# Patient Record
Sex: Male | Born: 1970 | Race: White | Hispanic: No | Marital: Married | Smoking: Current every day smoker
Health system: Southern US, Community
[De-identification: ages and names within clinical notes are randomized; demographics above are authoritative.]

## PROBLEM LIST (undated history)

## (undated) DIAGNOSIS — I10 Essential (primary) hypertension: Secondary | ICD-10-CM

## (undated) DIAGNOSIS — E785 Hyperlipidemia, unspecified: Secondary | ICD-10-CM

## (undated) DIAGNOSIS — Z8709 Personal history of other diseases of the respiratory system: Secondary | ICD-10-CM

## (undated) DIAGNOSIS — R160 Hepatomegaly, not elsewhere classified: Secondary | ICD-10-CM

## (undated) DIAGNOSIS — K219 Gastro-esophageal reflux disease without esophagitis: Secondary | ICD-10-CM

## (undated) DIAGNOSIS — J45909 Unspecified asthma, uncomplicated: Secondary | ICD-10-CM

## (undated) HISTORY — DX: Unspecified asthma, uncomplicated: J45.909

## (undated) HISTORY — PX: KNEE SURGERY: SHX244

## (undated) HISTORY — PX: SHOULDER SURGERY: SHX246

## (undated) HISTORY — DX: Personal history of other diseases of the respiratory system: Z87.09

## (undated) HISTORY — DX: Hyperlipidemia, unspecified: E78.5

## (undated) HISTORY — DX: Hepatomegaly, not elsewhere classified: R16.0

---

## 2005-02-22 ENCOUNTER — Ambulatory Visit: Payer: Self-pay | Admitting: Orthopaedic Surgery

## 2005-04-01 ENCOUNTER — Ambulatory Visit: Payer: Self-pay | Admitting: Orthopaedic Surgery

## 2010-11-08 ENCOUNTER — Ambulatory Visit: Payer: Self-pay | Admitting: Specialist

## 2010-11-23 ENCOUNTER — Ambulatory Visit: Payer: Self-pay | Admitting: Specialist

## 2010-11-30 ENCOUNTER — Ambulatory Visit: Payer: Self-pay | Admitting: Specialist

## 2012-08-18 ENCOUNTER — Ambulatory Visit: Payer: Self-pay | Admitting: Family Medicine

## 2012-08-21 ENCOUNTER — Encounter: Payer: Self-pay | Admitting: *Deleted

## 2012-08-24 ENCOUNTER — Ambulatory Visit: Payer: Self-pay | Admitting: Emergency Medicine

## 2012-08-24 LAB — SEDIMENTATION RATE: Erythrocyte Sed Rate: 5 mm/hr (ref 0–15)

## 2012-08-24 LAB — COMPREHENSIVE METABOLIC PANEL
Albumin: 4.3 g/dL (ref 3.4–5.0)
Alkaline Phosphatase: 88 U/L (ref 50–136)
Anion Gap: 14 (ref 7–16)
BUN: 21 mg/dL — ABNORMAL HIGH (ref 7–18)
Bilirubin,Total: 0.2 mg/dL (ref 0.2–1.0)
Calcium, Total: 10 mg/dL (ref 8.5–10.1)
Chloride: 103 mmol/L (ref 98–107)
Co2: 23 mmol/L (ref 21–32)
Creatinine: 1 mg/dL (ref 0.60–1.30)
EGFR (African American): >60
EGFR (Non-African Amer.): >60
Glucose: 101 mg/dL — ABNORMAL HIGH (ref 65–99)
Osmolality: 283 (ref 275–301)
Potassium: 3.8 mmol/L (ref 3.5–5.1)
SGOT(AST): 17 U/L (ref 15–37)
SGPT (ALT): 31 U/L (ref 12–78)
Sodium: 140 mmol/L (ref 136–145)
Total Protein: 8.4 g/dL — ABNORMAL HIGH (ref 6.4–8.2)

## 2012-08-24 LAB — CBC WITH DIFFERENTIAL/PLATELET
Basophil #: 0.1 10*3/uL (ref 0.0–0.1)
Basophil %: 1 %
Eosinophil #: 0.2 10*3/uL (ref 0.0–0.7)
Eosinophil %: 1.4 %
HCT: 44.4 % (ref 40.0–52.0)
HGB: 14.8 g/dL (ref 13.0–18.0)
Lymphocyte #: 3.1 10*3/uL (ref 1.0–3.6)
Lymphocyte %: 26.1 %
MCH: 29.6 pg (ref 26.0–34.0)
MCHC: 33.3 g/dL (ref 32.0–36.0)
MCV: 89 fL (ref 80–100)
Monocyte #: 0.8 x10 3/mm (ref 0.2–1.0)
Monocyte %: 7 %
Neutrophil #: 7.7 10*3/uL — ABNORMAL HIGH (ref 1.4–6.5)
Neutrophil %: 64.5 %
Platelet: 309 10*3/uL (ref 150–440)
RBC: 5 10*6/uL (ref 4.40–5.90)
RDW: 14.1 % (ref 11.5–14.5)
WBC: 12 10*3/uL — ABNORMAL HIGH (ref 3.8–10.6)

## 2012-08-24 LAB — TSH: Thyroid Stimulating Horm: 4.22 u[IU]/mL

## 2012-08-27 ENCOUNTER — Ambulatory Visit (INDEPENDENT_AMBULATORY_CARE_PROVIDER_SITE_OTHER): Payer: BC Managed Care – PPO | Admitting: Cardiovascular Disease

## 2012-08-27 ENCOUNTER — Encounter: Payer: Self-pay | Admitting: Cardiovascular Disease

## 2012-08-27 VITALS — BP 146/90 | HR 93 | Ht 70.0 in | Wt 269.5 lb

## 2012-08-27 DIAGNOSIS — R0602 Shortness of breath: Secondary | ICD-10-CM

## 2012-08-27 DIAGNOSIS — R000 Tachycardia, unspecified: Secondary | ICD-10-CM

## 2012-08-27 DIAGNOSIS — E785 Hyperlipidemia, unspecified: Secondary | ICD-10-CM

## 2012-08-27 DIAGNOSIS — G4730 Sleep apnea, unspecified: Secondary | ICD-10-CM

## 2012-08-27 DIAGNOSIS — R079 Chest pain, unspecified: Secondary | ICD-10-CM

## 2012-08-27 DIAGNOSIS — I10 Essential (primary) hypertension: Secondary | ICD-10-CM | POA: Insufficient documentation

## 2012-08-27 MED ORDER — METOPROLOL TARTRATE 25 MG PO TABS: 25.0000 mg | ORAL_TABLET | Freq: Two times a day (BID) | ORAL | Status: DC

## 2012-08-27 NOTE — Assessment & Plan Note (Signed)
He's had a history of hyperlipidemia in the past. We'll check his lipids at her next visit. He will be establishing with [facility] for primary care. She may want to get the labs in her office.  I've encouraged him to stay away from fried foods. He also needs to exercise on a daily basis.

## 2012-08-27 NOTE — Assessment & Plan Note (Signed)
Has symptoms consistent with sleep apnea. His wife has noticed on occasion and he seems to stop breathing. 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  We'll schedule him for sleep study for further evaluation.

## 2012-08-27 NOTE — Assessment & Plan Note (Signed)
Rodney Copeland presents today for further evaluation of his hypertension. His blood pressure was markedly elevated when he presented to urgent care several weeks ago. Since that time he has drastically improved his diet. He's eating a lot less salt. He is no longer eating any fried foods. He's lost 5 pounds and his blood pressures down significantly. He's not feeling quite as well as he would've hoped.  We will add metoprolol 25 mg twice a day to his medical regimen. My hope is that with some additional weight loss and treatment of possible sleep apnea that he may not need long-term blood pressure medicines.

## 2012-08-27 NOTE — Progress Notes (Signed)
     Rodney Copeland Date of Birth  [birth year]       [facility]    [place] [address]  [address] [place], [place]  [address]   [place], [place]  [address] [phone]     [phone]   Fax  [phone]    Fax [phone]  Problem List: 1. Hypertension 2. Possible sleep apnea  History of Present Illness:  He presents today for further evaluation of HTN.  He has been to urgent care on 2 different occasions .  BP was 170 / 110.    He is tired all of the time.  His wife thinks that he has "stopped breathing" at times when he is sleeping.  He is tired all of the time.  He can fall asleep at any time during the day.   These episodes have been going on about a month or so.  He's changed his diet for the better since seeing the doctor at urgent care. He is no longer eating a fried foods. He has cut out all of his soft drinks.  He's lost about 5 pounds since that time. He's not convinced that he is feeling better yet.    Current Outpatient Prescriptions on File Prior to Visit  Medication Sig Dispense Refill  . fluticasone (FLONASE) 50 MCG/ACT nasal spray Place 2 sprays into the nose daily.        No Known Allergies  Past Medical History  Diagnosis Date  . Hx of chronic sinusitis   . Hyperlipidemia   . Asthma   . Enlarged liver     Past Surgical History  Procedure Date  . Shoulder surgery     RIGHT  . Knee surgery     LEFT    History  Smoking status  . Current Every Day Smoker -- 1.0 packs/day for 20 years  . Types: Cigarettes  Smokeless tobacco  . Not on file    History  Alcohol Use No    Family History  Problem Relation Age of Onset  . Heart failure Mother   . Heart attack Father     Reviw of Systems:  Reviewed in the HPI.  All other systems are negative.  Physical Exam: Blood pressure 146/90, pulse 93, height 5\' 10"  (1.778 m), weight 269 lb 8 oz (122.244 kg). General: Well developed, well nourished, in  no acute distress.  Head: Normocephalic, atraumatic, sclera non-icteric, mucus membranes are moist,   Neck: Supple. Carotids are 2 + without bruits. No JVD   Lungs: Clear   Heart: RR, normal S1, S2  Abdomen: Soft, non-tender, non-distended with normal bowel sounds.  Msk:  Strength and tone are normal   Extremities: No clubbing or cyanosis. No edema.  Distal pedal pulses are 2+ and equal    Neuro: CN II - XII intact.  Alert and oriented X 3.   Psych:  Normal   ECG: Feb. 6, 2014:  NSR at 93, no ST /T abnormalities.  Assessment / Plan:

## 2012-08-27 NOTE — Patient Instructions (Addendum)
Your physician wants you to follow-up in: 2-3 months with Dr. Elease Hashimoto. You will receive a reminder letter in the mail two months in advance. If you don't receive a letter, please call our office to schedule the follow-up appointment.  Your physician has recommended that you have a sleep study. This test records several body functions during sleep, including: brain activity, eye movement, oxygen and carbon dioxide blood levels, heart rate and rhythm, breathing rate and rhythm, the flow of air through your mouth and nose, snoring, body muscle movements, and chest and belly movement.  Your physician has recommended you make the following change in your medication:  -start metoprolol 25 mg twice daily  STOP SMOKING  Labs with Dr. Dan Humphreys   DASH Diet The DASH diet stands for "Dietary Approaches to Stop Hypertension." It is a healthy eating plan that has been shown to reduce high blood pressure (hypertension) in as little as 14 days, while also possibly providing other significant health benefits. These other health benefits include reducing the risk of breast cancer after menopause and reducing the risk of type 2 diabetes, heart disease, colon cancer, and stroke. Health benefits also include weight loss and slowing kidney failure in patients with chronic kidney disease.   DIET GUIDELINES  Limit salt (sodium). Your diet should contain less than 1500 mg of sodium daily.   Limit refined or processed carbohydrates. Your diet should include mostly whole grains. Desserts and added sugars should be used sparingly.   Include small amounts of heart-healthy fats. These types of fats include nuts, oils, and tub margarine. Limit saturated and trans fats. These fats have been shown to be harmful in the body.  CHOOSING FOODS   The following food groups are based on a 2000 calorie diet. See your Registered Dietitian for individual calorie needs. Grains and Grain Products (6 to 8 servings daily)  Eat More Often:  Whole-wheat bread, brown rice, whole-grain or wheat pasta, quinoa, popcorn without added fat or salt (air popped).   Eat Less Often: White bread, white pasta, white rice, cornbread.  Vegetables (4 to 5 servings daily)  Eat More Often: Fresh, frozen, and canned vegetables. Vegetables may be raw, steamed, roasted, or grilled with a minimal amount of fat.   Eat Less Often/Avoid: Creamed or fried vegetables. Vegetables in a cheese sauce.  Fruit (4 to 5 servings daily)  Eat More Often: All fresh, canned (in natural juice), or frozen fruits. Dried fruits without added sugar. One hundred percent fruit juice ( cup [237 mL] daily).   Eat Less Often: Dried fruits with added sugar. Canned fruit in light or heavy syrup.  [employer], Fish, and Poultry (2 servings or less daily. One serving is 3 to 4 oz [85-114 g]).  Eat More Often: Ninety percent or leaner ground beef, tenderloin, sirloin. Round cuts of beef, chicken breast, [place] breast. All fish. Grill, bake, or broil your meat. Nothing should be fried.   Eat Less Often/Avoid: Fatty cuts of meat, [place], or chicken leg, thigh, or wing. Fried cuts of meat or fish.  Dairy (2 to 3 servings)  Eat More Often: Low-fat or fat-free milk, low-fat plain or light yogurt, reduced-fat or part-skim cheese.   Eat Less Often/Avoid: Milk (whole, 2%). Whole milk yogurt. Full-fat cheeses.  Nuts, Seeds, and Legumes (4 to 5 servings per week)  Eat More Often: All without added salt.   Eat Less Often/Avoid: Salted nuts and seeds, canned beans with added salt.  Fats and Sweets (limited)  Eat More  Often: Vegetable oils, tub margarines without trans fats, sugar-free gelatin. Mayonnaise and salad dressings.   Eat Less Often/Avoid: Coconut oils, palm oils, butter, stick margarine, cream, half and half, cookies, candy, pie.  FOR MORE INFORMATION The Dash Diet Eating Plan: www.dashdiet.org Document Released: 06/27/2011 Document Revised: 09/30/2011 Document Reviewed:  06/27/2011 [facility] Patient Information 2013 [place], [place].   Smoking Cessation Quitting smoking is important to your health and has many advantages. However, it is not always easy to quit since nicotine is a very addictive drug. Often times, people try 3 times or more before being able to quit. This document explains the best ways for you to prepare to quit smoking. Quitting takes hard work and a lot of effort, but you can do it. ADVANTAGES OF QUITTING SMOKING  You will live longer, feel better, and live better.   Your body will feel the impact of quitting smoking almost immediately.   Within 20 minutes, blood pressure decreases. Your pulse returns to its normal level.   After 8 hours, carbon monoxide levels in the blood return to normal. Your oxygen level increases.   After 24 hours, the chance of having a heart attack starts to decrease. Your breath, hair, and body stop smelling like smoke.   After 48 hours, damaged nerve endings begin to recover. Your sense of taste and smell improve.   After 72 hours, the body is virtually free of nicotine. Your bronchial tubes relax and breathing becomes easier.   After 2 to 12 weeks, lungs can hold more air. Exercise becomes easier and circulation improves.   The risk of having a heart attack, stroke, cancer, or lung disease is greatly reduced.   After 1 year, the risk of coronary heart disease is cut in half.   After 5 years, the risk of stroke falls to the same as a nonsmoker.   After 10 years, the risk of lung cancer is cut in half and the risk of other cancers decreases significantly.   After 15 years, the risk of coronary heart disease drops, usually to the level of a nonsmoker.   If you are pregnant, quitting smoking will improve your chances of having a healthy baby.   The people you live with, especially any children, will be healthier.   You will have extra money to spend on things other than cigarettes.  QUESTIONS TO THINK  ABOUT BEFORE ATTEMPTING TO QUIT You may want to talk about your answers with your caregiver.  Why do you want to quit?   If you tried to quit in the past, what helped and what did not?   What will be the most difficult situations for you after you quit? How will you plan to handle them?   Who can help you through the tough times? Your family? Friends? A caregiver?   What pleasures do you get from smoking? What ways can you still get pleasure if you quit?  Here are some questions to ask your caregiver:  How can you help me to be successful at quitting?   What medicine do you think would be best for me and how should I take it?   What should I do if I need more help?   What is smoking withdrawal like? How can I get information on withdrawal?  GET READY  Set a quit date.   Change your environment by getting rid of all cigarettes, ashtrays, matches, and lighters in your home, car, or work. Do not let people smoke in your  home.   Review your past attempts to quit. Think about what worked and what did not.  GET SUPPORT AND ENCOURAGEMENT You have a better chance of being successful if you have help. You can get support in many ways.  Tell your family, friends, and co-workers that you are going to quit and need their support. Ask them not to smoke around you.   Get individual, group, or telephone counseling and support. Programs are available at [employer] and health centers. Call your local health department for information about programs in your area.   Spiritual beliefs and practices may help some smokers quit.   Download a "quit meter" on your computer to keep track of quit statistics, such as how long you have gone without smoking, cigarettes not smoked, and money saved.   Get a self-help book about quitting smoking and staying off of tobacco.  LEARN NEW SKILLS AND BEHAVIORS  Distract yourself from urges to smoke. Talk to someone, go for a walk, or occupy your time with a  task.   Change your normal routine. Take a different route to work. Drink tea instead of coffee. Eat breakfast in a different place.   Reduce your stress. Take a hot bath, exercise, or read a book.   Plan something enjoyable to do every day. Reward yourself for not smoking.   Explore interactive web-based programs that specialize in helping you quit.  GET MEDICINE AND USE IT CORRECTLY Medicines can help you stop smoking and decrease the urge to smoke. Combining medicine with the above behavioral methods and support can greatly increase your chances of successfully quitting smoking.  Nicotine replacement therapy helps deliver nicotine to your body without the negative effects and risks of smoking. Nicotine replacement therapy includes nicotine gum, lozenges, inhalers, nasal sprays, and skin patches. Some may be available over-the-counter and others require a prescription.   Antidepressant medicine helps people abstain from smoking, but how this works is unknown. This medicine is available by prescription.   Nicotinic receptor partial agonist medicine simulates the effect of nicotine in your brain. This medicine is available by prescription.  Ask your caregiver for advice about which medicines to use and how to use them based on your health history. Your caregiver will tell you what side effects to look out for if you choose to be on a medicine or therapy. Carefully read the information on the package. Do not use any other product containing nicotine while using a nicotine replacement product.   RELAPSE OR DIFFICULT SITUATIONS Most relapses occur within the first 3 months after quitting. Do not be discouraged if you start smoking again. Remember, most people try several times before finally quitting. You may have symptoms of withdrawal because your body is used to nicotine. You may crave cigarettes, be irritable, feel very hungry, cough often, get headaches, or have difficulty concentrating. The  withdrawal symptoms are only temporary. They are strongest when you first quit, but they will go away within 10 14 days. To reduce the chances of relapse, try to:  Avoid drinking alcohol. Drinking lowers your chances of successfully quitting.   Reduce the amount of caffeine you consume. Once you quit smoking, the amount of caffeine in your body increases and can give you symptoms, such as a rapid heartbeat, sweating, and anxiety.   Avoid smokers because they can make you want to smoke.   Do not let weight gain distract you. Many smokers will gain weight when they quit, usually less than 10 pounds.  Eat a healthy diet and stay active. You can always lose the weight gained after you quit.   Find ways to improve your mood other than smoking.  FOR MORE INFORMATION   www.smokefree.gov   Document Released: 07/02/2001 Document Revised: 01/07/2012 Document Reviewed: 10/17/2011 [facility] Patient Information 2013 [place], [place].

## 2012-10-22 ENCOUNTER — Encounter: Payer: Self-pay | Admitting: Cardiovascular Disease

## 2012-10-22 ENCOUNTER — Ambulatory Visit (INDEPENDENT_AMBULATORY_CARE_PROVIDER_SITE_OTHER): Payer: BC Managed Care – PPO | Admitting: Cardiovascular Disease

## 2012-10-22 VITALS — BP 128/80 | HR 73 | Ht 70.0 in | Wt 263.8 lb

## 2012-10-22 DIAGNOSIS — I10 Essential (primary) hypertension: Secondary | ICD-10-CM

## 2012-10-22 DIAGNOSIS — G4730 Sleep apnea, unspecified: Secondary | ICD-10-CM

## 2012-10-22 NOTE — Progress Notes (Signed)
     Cain Sieve Date of Birth  [birth year]       [facility]    [place] [address]  [address] [place], [place]  [address]   [place], [place]  [address] [phone]     [phone]   Fax  [phone]    Fax [phone]  Problem List: 1. Hypertension 2. Possible sleep apnea  History of Present Illness:  He presents today for further evaluation of HTN.  He has been to urgent care on 2 different occasions .  BP was 170 / 110.    He is tired all of the time.  His wife thinks that he has "stopped breathing" at times when he is sleeping.  He is tired all of the time.  He can fall asleep at any time during the day.   These episodes have been going on about a month or so.  He's changed his diet for the better since seeing the doctor at urgent care. He is no longer eating a fried foods. He has cut out all of his soft drinks.  He's lost about 5 pounds since that time. He's not convinced that he is feeling better yet.  October 22, 2012:   he is feeling much better.    Current Outpatient Prescriptions on File Prior to Visit  Medication Sig Dispense Refill  . fluticasone (FLONASE) 50 MCG/ACT nasal spray Place 2 sprays into the nose daily.      . metoprolol tartrate (LOPRESSOR) 25 MG tablet Take 1 tablet (25 mg total) by mouth 2 (two) times daily.  60 tablet  12  . ASPIRIN PO Take by mouth as needed.       No current facility-administered medications on file prior to visit.    No Known Allergies  Past Medical History  Diagnosis Date  . Hx of chronic sinusitis   . Hyperlipidemia   . Asthma   . Enlarged liver     Past Surgical History  Procedure Laterality Date  . Shoulder surgery      RIGHT  . Knee surgery      LEFT    History  Smoking status  . Current Every Day Smoker -- 1.00 packs/day for 20 years  . Types: Cigarettes  Smokeless tobacco  . Not on file    History  Alcohol Use No    Family History  Problem Relation  Age of Onset  . Heart failure Mother   . Heart attack Father     Reviw of Systems:  Reviewed in the HPI.  All other systems are negative.  Physical Exam: Blood pressure 128/80, pulse 73, height 5\' 10"  (1.778 m), weight 263 lb 12 oz (119.636 kg). General: Well developed, well nourished, in no acute distress.  Head: Normocephalic, atraumatic, sclera non-icteric, mucus membranes are moist,   Neck: Supple. Carotids are 2 + without bruits. No JVD   Lungs: Clear   Heart: RR, normal S1, S2  Abdomen: Soft, non-tender, non-distended with normal bowel sounds.  Msk:  Strength and tone are normal   Extremities: No clubbing or cyanosis. No edema.  Distal pedal pulses are 2+ and equal    Neuro: CN II - XII intact.  Alert and oriented X 3.   Psych:  Normal   ECG: October 22, 2012:  NSR at 73, normal.   Assessment / Plan:

## 2012-10-22 NOTE — Assessment & Plan Note (Signed)
Rodney Copeland is feeling much better. He has changed his diet . He is eating a lot of salt. He is tolerating metoprolol without problems.  We will see him again in the office in 6 months for followup office visit. We'll check a basic profile at that time. I have encouraged him to continue the exercise.

## 2012-10-22 NOTE — Assessment & Plan Note (Signed)
He snores loudly. He was unable to get a sleep study because of insurance reasons. This does not make any sense to me. He has sleep apnea, this may be the cause of his underlying hypertension.  I have asked him to see if his medical Dr. Stann Mainland  make a referral for the sleep study. Perhaps this will make a  difference in whether or not the insurance company will  authorize the sleep study.

## 2012-10-22 NOTE — Patient Instructions (Addendum)
Your physician wants you to follow-up in: 6 months with Dr. Elease Hashimoto with labs at that time. You will receive a reminder letter in the mail two months in advance. If you don't receive a letter, please call our office to schedule the follow-up appointment.

## 2012-12-10 ENCOUNTER — Ambulatory Visit: Payer: BC Managed Care – PPO | Admitting: Internal Medicine

## 2013-04-24 ENCOUNTER — Ambulatory Visit: Payer: Self-pay | Admitting: Internal Medicine

## 2013-07-21 ENCOUNTER — Ambulatory Visit: Payer: Self-pay | Admitting: Physician Assistant

## 2014-03-18 ENCOUNTER — Ambulatory Visit: Payer: Self-pay | Admitting: Specialist

## 2015-08-04 ENCOUNTER — Encounter: Payer: Self-pay | Admitting: *Deleted

## 2015-08-04 ENCOUNTER — Ambulatory Visit
Admission: EM | Admit: 2015-08-04 | Discharge: 2015-08-04 | Disposition: A | Discharge status: Home / Self Care | Payer: 59 | Attending: Family Medicine | Admitting: Family Medicine

## 2015-08-04 DIAGNOSIS — R05 Cough: Secondary | ICD-10-CM | POA: Diagnosis not present

## 2015-08-04 DIAGNOSIS — R112 Nausea with vomiting, unspecified: Secondary | ICD-10-CM

## 2015-08-04 DIAGNOSIS — J0100 Acute maxillary sinusitis, unspecified: Secondary | ICD-10-CM

## 2015-08-04 DIAGNOSIS — R059 Cough, unspecified: Secondary | ICD-10-CM

## 2015-08-04 HISTORY — DX: Essential (primary) hypertension: I10

## 2015-08-04 MED ORDER — GUAIFENESIN-CODEINE 100-10 MG/5ML PO SOLN: ORAL | Status: DC

## 2015-08-04 MED ORDER — AMOXICILLIN 875 MG PO TABS: 875.0000 mg | ORAL_TABLET | Freq: Two times a day (BID) | ORAL | Status: DC

## 2015-08-04 MED ORDER — ONDANSETRON 8 MG PO TBDP: 8.0000 mg | ORAL_TABLET | Freq: Two times a day (BID) | ORAL | Status: DC

## 2015-08-04 NOTE — ED Notes (Signed)
Patient started having symptoms of nausea vomiting and diarrhea one week ago and symptoms resolved over the weekend. This past Tuesday symptoms of nausea, vomiting and diarrhea returned. Patient present with a sever cough that may be due to aspiration of vomit.

## 2015-08-04 NOTE — ED Provider Notes (Signed)
CSN: [id number]     Arrival date & time 08/04/15  1532 History   First MD Initiated Contact with Patient 08/04/15 1622     Chief Complaint  Patient presents with  . Cough  . Nausea   (Consider location/radiation/quality/duration/timing/severity/associated sxs/prior Treatment) Patient is a 45 y.o. male presenting with URI. The history is provided by the patient.  URI Presenting symptoms: congestion, cough, ear pain, facial pain and fever   Severity:  Moderate Onset quality:  Sudden Duration:  1 week Timing:  Constant Progression:  Worsening Chronicity:  New Relieved by:  Nothing Associated symptoms: headaches and sinus pain   Associated symptoms comment:  Vomiting x 3 today; "throwing up mucus"   Past Medical History  Diagnosis Date  . Hx of chronic sinusitis   . Hyperlipidemia   . Asthma   . Enlarged liver   . Hypertension    Past Surgical History  Procedure Laterality Date  . Shoulder surgery      RIGHT  . Knee surgery      LEFT   Family History  Problem Relation Age of Onset  . Heart failure Mother   . Heart attack Father    Social History  Substance Use Topics  . Smoking status: Current Every Day Smoker -- 1.00 packs/day for 20 years    Types: Cigarettes  . Smokeless tobacco: None  . Alcohol Use: No    Review of Systems  Constitutional: Positive for fever.  HENT: Positive for congestion and ear pain.   Respiratory: Positive for cough.   Neurological: Positive for headaches.    Allergies  Review of patient's allergies indicates no known allergies.  Home Medications   Prior to Admission medications   Medication Sig Start Date End Date Taking? Authorizing Provider  amoxicillin (AMOXIL) 875 MG tablet Take 1 tablet (875 mg total) by mouth 2 (two) times daily. 08/04/15   Payton Mccallumrlando Robinette Esters, MD  ASPIRIN PO Take by mouth as needed.    Historical Provider, MD  fluticasone (FLONASE) 50 MCG/ACT nasal spray Place 2 sprays into the nose daily.    Historical  Provider, MD  guaiFENesin-codeine 100-10 MG/5ML syrup 5-10 ml po tid prn cough 08/04/15   Payton Mccallumrlando Kennie Karapetian, MD  metoprolol tartrate (LOPRESSOR) 25 MG tablet Take 1 tablet (25 mg total) by mouth 2 (two) times daily. 08/27/12   Vesta MixerPhilip J Nahser, MD  ondansetron (ZOFRAN ODT) 8 MG disintegrating tablet Take 1 tablet (8 mg total) by mouth 2 (two) times daily. 08/04/15   Payton Mccallumrlando Asante Ritacco, MD   Meds Ordered and Administered this Visit  Medications - No data to display  BP 136/84 mmHg  Pulse 110  Temp(Src) 99.3 F (37.4 C) (Oral)  Resp 22  Ht 5\' 10"  (1.778 m)  Wt 275 lb (124.739 kg)  BMI 39.46 kg/m2  SpO2 98% No data found.   Physical Exam  Constitutional: He appears well-developed and well-nourished. No distress.  HENT:  Head: Normocephalic and atraumatic.  Right Ear: External ear and ear canal normal. A middle ear effusion is present.  Left Ear: External ear and ear canal normal. A middle ear effusion is present.  Nose: Mucosal edema and rhinorrhea present. Right sinus exhibits maxillary sinus tenderness and frontal sinus tenderness. Left sinus exhibits maxillary sinus tenderness and frontal sinus tenderness.  Mouth/Throat: Uvula is midline and mucous membranes are normal. Posterior oropharyngeal erythema present. No oropharyngeal exudate, posterior oropharyngeal edema or tonsillar abscesses.  Eyes: Conjunctivae and EOM are normal. Pupils are equal, round, and reactive to light.  Right eye exhibits no discharge. Left eye exhibits no discharge. No scleral icterus.  Neck: Normal range of motion. Neck supple. No tracheal deviation present. No thyromegaly present.  Cardiovascular: Normal rate, regular rhythm and normal heart sounds.   Pulmonary/Chest: Effort normal and breath sounds normal. No stridor. No respiratory distress. He has no wheezes. He has no rales. He exhibits no tenderness.  Lymphadenopathy:    He has no cervical adenopathy.  Neurological: He is alert.  Skin: Skin is warm and dry. No rash  noted. He is not diaphoretic.  Nursing note and vitals reviewed.   ED Course  Procedures (including critical care time)  Labs Review Labs Reviewed - No data to display  Imaging Review No results found.   Visual Acuity Review  Right Eye Distance:   Left Eye Distance:   Bilateral Distance:    Right Eye Near:   Left Eye Near:    Bilateral Near:         MDM   1. Acute maxillary sinusitis, recurrence not specified   2. Cough   3. Non-intractable vomiting with nausea, vomiting of unspecified type    New Prescriptions   AMOXICILLIN (AMOXIL) 875 MG TABLET    Take 1 tablet (875 mg total) by mouth 2 (two) times daily.   GUAIFENESIN-CODEINE 100-10 MG/5ML SYRUP    5-10 ml po tid prn cough   ONDANSETRON (ZOFRAN ODT) 8 MG DISINTEGRATING TABLET    Take 1 tablet (8 mg total) by mouth 2 (two) times daily.   1. diagnosis reviewed with patient 2. rx as per orders above; reviewed possible side effects, interactions, risks and benefits  3. Recommend supportive treatment with otc analgesics, otc flonase, clear liquids then advance slowly as tolerated 4. Follow-up prn if symptoms worsen or don't improve  Payton Mccallum, MD 08/04/15 1730

## 2018-09-03 ENCOUNTER — Other Ambulatory Visit: Payer: Self-pay | Admitting: Student

## 2018-09-03 DIAGNOSIS — M25512 Pain in left shoulder: Secondary | ICD-10-CM

## 2018-09-03 DIAGNOSIS — M75112 Incomplete rotator cuff tear or rupture of left shoulder, not specified as traumatic: Secondary | ICD-10-CM

## 2018-09-11 ENCOUNTER — Ambulatory Visit
Admission: RE | Admit: 2018-09-11 | Discharge: 2018-09-11 | Disposition: A | Discharge status: Home / Self Care | Payer: 59 | Source: Ambulatory Visit | Attending: Student | Admitting: Student

## 2018-09-11 ENCOUNTER — Encounter (INDEPENDENT_AMBULATORY_CARE_PROVIDER_SITE_OTHER): Payer: Self-pay

## 2018-09-11 DIAGNOSIS — M75112 Incomplete rotator cuff tear or rupture of left shoulder, not specified as traumatic: Secondary | ICD-10-CM | POA: Diagnosis present

## 2018-09-11 DIAGNOSIS — M25512 Pain in left shoulder: Secondary | ICD-10-CM

## 2019-01-06 ENCOUNTER — Telehealth: Payer: Self-pay | Admitting: Internal Medicine

## 2019-01-06 NOTE — Telephone Encounter (Signed)
Okay to proceed, as pt was seen by PCP on 10/14/2018 for chronic cough.

## 2019-01-06 NOTE — Telephone Encounter (Signed)
Called patient for COVID-19 pre-screening for in office visit.  Have you recently traveled any where out of the local area in the last [facility]   Have you been in close contact with a person diagnosed with COVID-19 within the last 2 weeks? No  Do you currently have any of the following symptoms? If so, when did they start? Cough (Yes- smokers cough)   Diarrhea Joint Pain Fever     Muscle Pain   Red eyes Shortness of breath  Abdominal pain  Vomiting Loss of smell   Rash               Sore Throat Headache   Weakness  Bruising or bleeding

## 2019-01-07 ENCOUNTER — Ambulatory Visit (INDEPENDENT_AMBULATORY_CARE_PROVIDER_SITE_OTHER): Payer: 59 | Admitting: Internal Medicine

## 2019-01-07 ENCOUNTER — Ambulatory Visit: Payer: 59

## 2019-01-07 ENCOUNTER — Encounter: Payer: Self-pay | Admitting: Internal Medicine

## 2019-01-07 ENCOUNTER — Ambulatory Visit
Admission: RE | Admit: 2019-01-07 | Discharge: 2019-01-07 | Disposition: A | Discharge status: Home / Self Care | Payer: 59 | Source: Ambulatory Visit | Attending: Internal Medicine | Admitting: Internal Medicine

## 2019-01-07 ENCOUNTER — Other Ambulatory Visit: Payer: Self-pay

## 2019-01-07 VITALS — BP 144/88 | HR 92 | Temp 98.1°F | Ht 69.5 in | Wt 295.4 lb

## 2019-01-07 DIAGNOSIS — J42 Unspecified chronic bronchitis: Secondary | ICD-10-CM | POA: Insufficient documentation

## 2019-01-07 DIAGNOSIS — G4719 Other hypersomnia: Secondary | ICD-10-CM

## 2019-01-07 MED ORDER — BUDESONIDE-FORMOTEROL FUMARATE 160-4.5 MCG/ACT IN AERO: 2.0000 | INHALATION_SPRAY | Freq: Two times a day (BID) | RESPIRATORY_TRACT | 12 refills | Status: AC

## 2019-01-07 NOTE — Progress Notes (Addendum)
Rodney [place]      Assessment and Plan:  Dyspnea. - Progressive dyspnea for the past year, suspect this is multifactorial from possible asthma/chronic bronchitis, nicotine abuse, 40 pound weight gain over the past year. - We will start empiric Symbicort inhaler 2 puffs twice daily, asked to rinse mouth after use. - I have also asked him to increase his physical activity and attempt weight loss which may help with his breathing. - We will check lung function test and baseline chest x-ray.  Nicotine abuse. - Discussed importance of cessation, spent 3 minutes in discussion.  Obesity. - Recent weight gain of about 40 pounds which may be contributing and coinciding with recent increase in dyspnea. - He has a shoulder injury which is decreased his physical activity level, discussed importance of increasing his physical activity and limiting calories.  Excessive daytime sleepiness -Symptoms and signs of obstructive sleep apnea. - We will send for sleep study, start on CPAP as indicated.  Orders Placed This Encounter  Procedures  . DG Chest 2 View  . Pulmonary Function Test [facility] Only  . Home sleep test   Meds ordered this encounter  Medications  . budesonide-formoterol (SYMBICORT) 160-4.5 MCG/ACT inhaler    Sig: Inhale 2 puffs into the lungs 2 (two) times daily. Rinse mouth after use    Dispense:  1 Inhaler    Refill:  12   Return in about 3 months (around 04/09/2019).   Date: 01/07/2019  MRN# [id number] Rodney [facility] 10/19/1970    Rodney [facility] is a 48 y.o. old male seen in [place] for chief complaint of:    Chief Complaint  Patient presents with  . Consult    Per Janyce Llanos for asthma and possible COPD, get short of breath during physical activity, several cases of bronchitits in the last 10 years, some smokers cough, occasionally productive in the mornings, chest tightness with shortness of breath     HPI:   He has been  having trouble breathing for about 2 years and progressive, now worse over the past year. Made worse by walking especially if he is carrying something heavy. Relieved by rest, he has been using albuterol which helps sometimes.  Breathing is worse by hot, humid days.  He has 3 dogs, they are occasionally in bedroom.  He has had reflux now controlled with diet.  He has stuffy nose, takes flonase occasionally.   He has never been tested for allergies He is smoking about a ppd, he has tried chantix but made him very nauseated. He has quit for 1.5 yrs 10 years ago, has quit several other times since then for a few months at a time.  His weight has gone up about 40 pounds in past year, he has had a left shoulder injury and has not been doing as much physical activity since then.   He is sleepy during the day, he has been told that he needs to get a sleep study but has put it off due to time constraints, epworth is 12 today. He snores loudly and has been told that he stops breathing in his sleep.      PMHX:   Past Medical History:  Diagnosis Date  . Asthma   . Enlarged liver   . Hx of chronic sinusitis   . Hyperlipidemia   . Hypertension    Surgical Hx:  Past Surgical History:  Procedure Laterality Date  . KNEE SURGERY     LEFT  . SHOULDER  SURGERY     RIGHT   Family Hx:  Family History  Problem Relation Age of Onset  . Heart failure Mother   . Heart attack Father    Social Hx:   Social History   Tobacco Use  . Smoking status: Current Every Day Smoker    Packs/day: 1.00    Years: 20.00    Pack years: 20.00    Types: Cigarettes  . Smokeless tobacco: Former NeurosurgeonUser  . Tobacco comment: vaped for about 9 months, two years ago  Substance Use Topics  . Alcohol use: No  . Drug use: No   Medication:    Current Outpatient Medications:  .  ASPIRIN PO, Take 81 mg by mouth daily. , Disp: , Rfl:  .  fluticasone (FLONASE) 50 MCG/ACT nasal spray, Place 2 sprays into the nose daily.,  Disp: , Rfl:    Allergies:  Patient has no known allergies.  Review of Systems: Gen:  Denies  fever, sweats, chills HEENT: Denies blurred vision, double vision. bleeds, sore throat Cvc:  No dizziness, chest pain. Resp:   Denies cough or sputum production, shortness of breath Gi: Denies swallowing difficulty, stomach pain. Gu:  Denies bladder incontinence, burning urine Ext:   No Joint pain, stiffness. Skin: No skin rash,  hives  Endoc:  No polyuria, polydipsia. Psych: No depression, insomnia. Other:  All other systems were reviewed with the patient and were negative other that what is mentioned in the HPI.   Physical Examination:   VS: BP (!) 144/88 (BP Location: Left Arm, Cuff Size: Normal)   Pulse 92   Temp 98.1 F (36.7 C) (Temporal)   Ht 5' 9.5" (1.765 m)   Wt 295 lb 6.4 oz (134 kg)   SpO2 98%   BMI 43.00 kg/m   General Appearance: No distress  Neuro:without focal findings,  speech normal, Mallampati 3. HEENT: PERRLA, EOM intact.   Pulmonary: normal breath sounds, No wheezing.  CardiovascularNormal S1,S2.  No m/r/g.   Abdomen: Benign, Soft, non-tender. Renal:  No costovertebral tenderness  GU:  No performed at this time. Endoc: No evident thyromegaly, no signs of acromegaly. Skin:   warm, no rashes, no ecchymosis  Extremities: normal, no cyanosis, clubbing.  Other findings:    LABORATORY PANEL:   CBC No results for input(s): WBC, HGB, HCT, PLT in the last 168 hours. ------------------------------------------------------------------------------------------------------------------  Chemistries  No results for input(s): NA, K, CL, CO2, GLUCOSE, BUN, CREATININE, CALCIUM, MG, AST, ALT, ALKPHOS, BILITOT in the last 168 hours.  Invalid input(s): GFRCGP ------------------------------------------------------------------------------------------------------------------  Cardiac Enzymes No results for input(s): TROPONINI in the last 168 hours.  ------------------------------------------------------------  RADIOLOGY:  No results found.     Thank  you for the [facility] and for allowing [facility] to assist in the care of your patient. Our recommendations are noted above.  Please contact us if we can be of further service.   Wells Guileseep Aram Domzalski, M.D., F.C.C.P.  Board Certified in Internal Medicine, Pulmonary Medicine, Critical Care Medicine, and Sleep Medicine.  [facility] Office Number: [phone]   01/07/2019

## 2019-01-07 NOTE — Patient Instructions (Addendum)
Will send for sleep study.  Start symbicort 2 puffs twice daily, rinse mouth each use.  Will check chest xray and lung function test.  Weight loss will be helpful for your breathing.    Sleep Apnea    Sleep apnea is disorder that affects a person's sleep. A person with sleep apnea has abnormal pauses in their breathing when they sleep. It is hard for them to get a good sleep. This makes a person tired during the day. It also can lead to other physical problems. There are three types of sleep apnea. One type is when breathing stops for a short time because your airway is blocked (obstructive sleep apnea). Another type is when the brain sometimes fails to give the normal signal to breathe to the muscles that control your breathing (central sleep apnea). The third type is a combination of the other two types.  HOME CARE   Take all medicine as told by your doctor.  Avoid alcohol, calming medicines (sedatives), and depressant drugs.  Try to lose weight if you are overweight. Talk to your doctor about a healthy weight goal.  Your doctor may have you use a device that helps to open your airway. It can help you get the air that you need. It is called a positive airway pressure (PAP) device.   MAKE SURE YOU:   Understand these instructions.  Will watch your condition.  Will get help right away if you are not doing well or get worse.  It may take approximately 1 month for you to get used to wearing her CPAP every night.  Be sure to work with your machine to get used to it, be patient, it may take time!  If you have trouble tolerating CPAP DO NOT RETURN YOUR MACHINE; Contact our office to see if we can help you tolerate the CPAP better first!

## 2019-01-14 ENCOUNTER — Other Ambulatory Visit: Payer: Self-pay

## 2019-01-14 ENCOUNTER — Ambulatory Visit: Payer: 59

## 2019-01-14 ENCOUNTER — Encounter
Admission: RE | Admit: 2019-01-14 | Discharge: 2019-01-14 | Disposition: A | Discharge status: Home / Self Care | Payer: 59 | Source: Ambulatory Visit | Attending: Surgery | Admitting: Surgery

## 2019-01-14 DIAGNOSIS — G4733 Obstructive sleep apnea (adult) (pediatric): Secondary | ICD-10-CM | POA: Diagnosis not present

## 2019-01-14 DIAGNOSIS — G4719 Other hypersomnia: Secondary | ICD-10-CM

## 2019-01-14 DIAGNOSIS — Z01810 Encounter for preprocedural cardiovascular examination: Secondary | ICD-10-CM | POA: Insufficient documentation

## 2019-01-14 DIAGNOSIS — Z1159 Encounter for screening for other viral diseases: Secondary | ICD-10-CM | POA: Insufficient documentation

## 2019-01-14 HISTORY — DX: Gastro-esophageal reflux disease without esophagitis: K21.9

## 2019-01-14 NOTE — Patient Instructions (Addendum)
Your procedure is scheduled on: 01-19-19 TUESDAY Report to Same Day Surgery 2nd floor medical mall [facility](Medical Mall Entrance-take elevator on left to 2nd floor.  Check in with surgery information desk.) To find out your arrival time please call [phone] between 1PM - 3PM on 01-18-19 MONDAY  Remember: Instructions that are not followed completely may result in serious medical risk, up to and including death, or upon the discretion of your surgeon and anesthesiologist your surgery may need to be rescheduled.    _x___ 1. Do not eat food after midnight the night before your procedure. NO GUM OR CANDY AFTER MIDNIGHT. You may drink clear liquids up to 2 hours before you are scheduled to arrive at the hospital for your procedure.  Do not drink clear liquids within 2 hours of your scheduled arrival to the hospital.  Clear liquids include  --Water or Apple juice without pulp  --Clear carbohydrate beverage such as ClearFast or Gatorade  --Black Coffee or Clear Tea (No milk, no creamers, do not add anything to the coffee or Tea   ____Ensure clear carbohydrate drink on the way to the hospital for bariatric patients  ____Ensure clear carbohydrate drink 3 hours before surgery for Dr Rutherford NailByrnett's patients if physician instructed.    __x__ 2. No Alcohol for 24 hours before or after surgery.   __x__3. No Smoking or e-cigarettes for 24 prior to surgery.  Do not use any chewable tobacco products for at least 6 hour prior to surgery   ____  4. Bring all medications with you on the day of surgery if instructed.    __x__ 5. Notify your doctor if there is any change in your medical condition     (cold, fever, infections).    x___6. On the morning of surgery brush your teeth with toothpaste and water.  You may rinse your mouth with mouth wash if you wish.  Do not swallow any toothpaste or mouthwash.   Do not wear jewelry, make-up, hairpins, clips or nail polish.  Do not wear lotions, powders, or perfumes. You  may wear deodorant.  Do not shave 48 hours prior to surgery. Men may shave face and neck.  Do not bring valuables to the hospital.    [facility] is not responsible for any belongings or valuables.               Contacts, dentures or bridgework may not be worn into surgery.  Leave your suitcase in the car. After surgery it may be brought to your room.  For patients admitted to the hospital, discharge time is determined by your treatment team.  _  Patients discharged the day of surgery will not be allowed to drive home.  You will need someone to drive you home and stay with you the night of your procedure.    Please read over the following fact sheets that you were given:   [facility] Preparing for [facility]   _x___ TAKE THE FOLLOWING MEDICATION THE MORNING OF SURGERY WITH A SMALL SIP OF WATER. These include:  1. CRESTOR  2. NEXIUM   3. TAKE A NEXIUM THE NIGHT BEFORE YOUR SURGERY  4.  5.  6.  ____Fleets enema or Magnesium Citrate as directed.   _x___ Use CHG Soap or sage wipes as directed on instruction sheet   _X__ Use inhalers on the day of surgery and bring to hospital day of surgery-USE YOUR SYMBICORT AND VENTOLIN INHALER AT HOME DAY OF SURGERY AND BRING VENTOLIN INHALER TO HOSPITAL  ____ Stop Metformin and Janumet 2 days prior to surgery.    ____ Take 1/2 of usual insulin dose the night before surgery and none on the morning surgery.   _x___ Follow recommendations from Cardiologist, Pulmonologist or PCP regarding stopping Aspirin, Coumadin, Plavix ,Eliquis, Effient, or Pradaxa, and Pletal-STOP YOUR ASPIRIN NOW  X____Stop Anti-inflammatories such as Advil, Aleve, Ibuprofen, Motrin, Naproxen, Naprosyn, Goodies powders or aspirin products NOW-OK to take Tylenol    ____ Stop supplements until after surgery.     ____ Bring C-Pap to the hospital.

## 2019-01-15 ENCOUNTER — Other Ambulatory Visit
Admission: RE | Admit: 2019-01-15 | Discharge: 2019-01-15 | Disposition: A | Discharge status: Home / Self Care | Payer: 59 | Source: Ambulatory Visit | Attending: Surgery | Admitting: Surgery

## 2019-01-15 DIAGNOSIS — Z01810 Encounter for preprocedural cardiovascular examination: Secondary | ICD-10-CM | POA: Diagnosis not present

## 2019-01-16 LAB — NOVEL CORONAVIRUS, NAA (HOSP ORDER, SEND-OUT TO REF LAB; TAT 18-24 HRS): SARS-CoV-2, NAA: NOT DETECTED

## 2019-01-18 MED ORDER — DEXTROSE 5 % IV SOLN
3.0000 g | Freq: Once | INTRAVENOUS | Status: AC
  Administered 2019-01-19: 3 g via INTRAVENOUS
  Filled 2019-01-18: qty 3

## 2019-01-19 ENCOUNTER — Ambulatory Visit: Payer: 59 | Admitting: Anesthesiology

## 2019-01-19 ENCOUNTER — Encounter: Payer: Self-pay | Admitting: *Deleted

## 2019-01-19 ENCOUNTER — Ambulatory Visit
Admission: RE | Admit: 2019-01-19 | Discharge: 2019-01-19 | Disposition: A | Discharge status: Home / Self Care | Payer: 59 | Attending: Surgery | Admitting: Surgery

## 2019-01-19 ENCOUNTER — Other Ambulatory Visit: Payer: Self-pay

## 2019-01-19 ENCOUNTER — Encounter
Admission: RE | Disposition: A | Discharge status: Home / Self Care | Payer: Self-pay | Source: Home / Self Care | Attending: Surgery

## 2019-01-19 DIAGNOSIS — K219 Gastro-esophageal reflux disease without esophagitis: Secondary | ICD-10-CM | POA: Diagnosis not present

## 2019-01-19 DIAGNOSIS — G4730 Sleep apnea, unspecified: Secondary | ICD-10-CM | POA: Insufficient documentation

## 2019-01-19 DIAGNOSIS — J329 Chronic sinusitis, unspecified: Secondary | ICD-10-CM | POA: Insufficient documentation

## 2019-01-19 DIAGNOSIS — M7582 Other shoulder lesions, left shoulder: Secondary | ICD-10-CM | POA: Diagnosis not present

## 2019-01-19 DIAGNOSIS — S43432A Superior glenoid labrum lesion of left shoulder, initial encounter: Secondary | ICD-10-CM | POA: Insufficient documentation

## 2019-01-19 DIAGNOSIS — J45909 Unspecified asthma, uncomplicated: Secondary | ICD-10-CM | POA: Diagnosis not present

## 2019-01-19 DIAGNOSIS — M7542 Impingement syndrome of left shoulder: Secondary | ICD-10-CM | POA: Insufficient documentation

## 2019-01-19 DIAGNOSIS — E669 Obesity, unspecified: Secondary | ICD-10-CM | POA: Insufficient documentation

## 2019-01-19 DIAGNOSIS — Z79899 Other long term (current) drug therapy: Secondary | ICD-10-CM | POA: Diagnosis not present

## 2019-01-19 DIAGNOSIS — Z808 Family history of malignant neoplasm of other organs or systems: Secondary | ICD-10-CM | POA: Diagnosis not present

## 2019-01-19 DIAGNOSIS — I10 Essential (primary) hypertension: Secondary | ICD-10-CM | POA: Insufficient documentation

## 2019-01-19 DIAGNOSIS — Z820 Family history of epilepsy and other diseases of the nervous system: Secondary | ICD-10-CM | POA: Insufficient documentation

## 2019-01-19 DIAGNOSIS — Z818 Family history of other mental and behavioral disorders: Secondary | ICD-10-CM | POA: Diagnosis not present

## 2019-01-19 DIAGNOSIS — E785 Hyperlipidemia, unspecified: Secondary | ICD-10-CM | POA: Diagnosis not present

## 2019-01-19 DIAGNOSIS — Z7951 Long term (current) use of inhaled steroids: Secondary | ICD-10-CM | POA: Diagnosis not present

## 2019-01-19 DIAGNOSIS — Z833 Family history of diabetes mellitus: Secondary | ICD-10-CM | POA: Diagnosis not present

## 2019-01-19 DIAGNOSIS — W19XXXA Unspecified fall, initial encounter: Secondary | ICD-10-CM | POA: Diagnosis not present

## 2019-01-19 DIAGNOSIS — F17200 Nicotine dependence, unspecified, uncomplicated: Secondary | ICD-10-CM | POA: Diagnosis not present

## 2019-01-19 DIAGNOSIS — Z8249 Family history of ischemic heart disease and other diseases of the circulatory system: Secondary | ICD-10-CM | POA: Diagnosis not present

## 2019-01-19 DIAGNOSIS — Z6841 Body Mass Index (BMI) 40.0 and over, adult: Secondary | ICD-10-CM | POA: Diagnosis not present

## 2019-01-19 DIAGNOSIS — Z7982 Long term (current) use of aspirin: Secondary | ICD-10-CM | POA: Insufficient documentation

## 2019-01-19 HISTORY — PX: SHOULDER ARTHROSCOPY WITH OPEN ROTATOR CUFF REPAIR: SHX6092

## 2019-01-19 SURGERY — ARTHROSCOPY, SHOULDER WITH REPAIR, ROTATOR CUFF, OPEN
Anesthesia: General | Site: Shoulder | Laterality: Left

## 2019-01-19 MED ORDER — LACTATED RINGERS IV SOLN: INTRAVENOUS | Status: DC

## 2019-01-19 MED ORDER — MIDAZOLAM HCL 2 MG/2ML IJ SOLN
INTRAMUSCULAR | Status: AC
  Filled 2019-01-19: qty 2

## 2019-01-19 MED ORDER — ACETAMINOPHEN 10 MG/ML IV SOLN
INTRAVENOUS | Status: AC
  Filled 2019-01-19: qty 100

## 2019-01-19 MED ORDER — BUPIVACAINE LIPOSOME 1.3 % IJ SUSP
INTRAMUSCULAR | Status: DC | PRN
  Administered 2019-01-19: 20 mL via PERINEURAL

## 2019-01-19 MED ORDER — LACTATED RINGERS IV SOLN
INTRAVENOUS | Status: DC | PRN
  Administered 2019-01-19: 10:00:00 3000 mL

## 2019-01-19 MED ORDER — PROMETHAZINE HCL 25 MG/ML IJ SOLN: 6.2500 mg | INTRAMUSCULAR | Status: DC | PRN

## 2019-01-19 MED ORDER — POTASSIUM CHLORIDE IN NACL 20-0.9 MEQ/L-% IV SOLN: INTRAVENOUS | Status: DC

## 2019-01-19 MED ORDER — SUGAMMADEX SODIUM 200 MG/2ML IV SOLN
INTRAVENOUS | Status: DC | PRN
  Administered 2019-01-19: 200 mg via INTRAVENOUS

## 2019-01-19 MED ORDER — MEPERIDINE HCL 50 MG/ML IJ SOLN: 6.2500 mg | INTRAMUSCULAR | Status: DC | PRN

## 2019-01-19 MED ORDER — OXYCODONE HCL 5 MG/5ML PO SOLN: 5.0000 mg | Freq: Once | ORAL | Status: DC | PRN

## 2019-01-19 MED ORDER — LIDOCAINE HCL (PF) 1 % IJ SOLN
INTRAMUSCULAR | Status: AC
  Filled 2019-01-19: qty 5

## 2019-01-19 MED ORDER — OXYCODONE HCL 5 MG PO TABS: 5.0000 mg | ORAL_TABLET | ORAL | Status: DC | PRN

## 2019-01-19 MED ORDER — DEXMEDETOMIDINE HCL 200 MCG/2ML IV SOLN
INTRAVENOUS | Status: DC | PRN
  Administered 2019-01-19: 16 ug via INTRAVENOUS

## 2019-01-19 MED ORDER — LIDOCAINE HCL (CARDIAC) PF 100 MG/5ML IV SOSY
PREFILLED_SYRINGE | INTRAVENOUS | Status: DC | PRN
  Administered 2019-01-19: 100 mg via INTRAVENOUS

## 2019-01-19 MED ORDER — BUPIVACAINE-EPINEPHRINE (PF) 0.5% -1:200000 IJ SOLN
INTRAMUSCULAR | Status: AC
  Filled 2019-01-19: qty 30

## 2019-01-19 MED ORDER — LIDOCAINE HCL (PF) 1 % IJ SOLN
INTRAMUSCULAR | Status: DC | PRN
  Administered 2019-01-19: 3 mL

## 2019-01-19 MED ORDER — PHENYLEPHRINE HCL (PRESSORS) 10 MG/ML IV SOLN
INTRAVENOUS | Status: DC | PRN
  Administered 2019-01-19: 200 ug via INTRAVENOUS
  Administered 2019-01-19: 100 ug via INTRAVENOUS
  Administered 2019-01-19: 200 ug via INTRAVENOUS

## 2019-01-19 MED ORDER — FAMOTIDINE 20 MG PO TABS
20.0000 mg | ORAL_TABLET | Freq: Once | ORAL | Status: AC
  Administered 2019-01-19: 20 mg via ORAL

## 2019-01-19 MED ORDER — METOCLOPRAMIDE HCL 5 MG/ML IJ SOLN: 5.0000 mg | Freq: Three times a day (TID) | INTRAMUSCULAR | Status: DC | PRN

## 2019-01-19 MED ORDER — METOCLOPRAMIDE HCL 10 MG PO TABS: 5.0000 mg | ORAL_TABLET | Freq: Three times a day (TID) | ORAL | Status: DC | PRN

## 2019-01-19 MED ORDER — EPINEPHRINE PF 1 MG/ML IJ SOLN
INTRAMUSCULAR | Status: AC
  Filled 2019-01-19: qty 2

## 2019-01-19 MED ORDER — SEVOFLURANE IN SOLN
RESPIRATORY_TRACT | Status: AC
  Filled 2019-01-19: qty 250

## 2019-01-19 MED ORDER — PROPOFOL 10 MG/ML IV BOLUS
INTRAVENOUS | Status: DC | PRN
  Administered 2019-01-19: 200 mg via INTRAVENOUS

## 2019-01-19 MED ORDER — DEXAMETHASONE SODIUM PHOSPHATE 10 MG/ML IJ SOLN
INTRAMUSCULAR | Status: DC | PRN
  Administered 2019-01-19: 10 mg via INTRAVENOUS

## 2019-01-19 MED ORDER — FENTANYL CITRATE (PF) 100 MCG/2ML IJ SOLN: 25.0000 ug | INTRAMUSCULAR | Status: DC | PRN

## 2019-01-19 MED ORDER — BUPIVACAINE-EPINEPHRINE 0.5% -1:200000 IJ SOLN
INTRAMUSCULAR | Status: DC | PRN
  Administered 2019-01-19: 30 mL

## 2019-01-19 MED ORDER — MIDAZOLAM HCL 2 MG/2ML IJ SOLN
INTRAMUSCULAR | Status: DC | PRN
  Administered 2019-01-19: 2 mg via INTRAVENOUS

## 2019-01-19 MED ORDER — ACETAMINOPHEN 10 MG/ML IV SOLN
INTRAVENOUS | Status: DC | PRN
  Administered 2019-01-19: 1000 mg via INTRAVENOUS

## 2019-01-19 MED ORDER — OXYCODONE HCL 5 MG PO TABS: 5.0000 mg | ORAL_TABLET | Freq: Once | ORAL | Status: DC | PRN

## 2019-01-19 MED ORDER — ONDANSETRON HCL 4 MG/2ML IJ SOLN
INTRAMUSCULAR | Status: DC | PRN
  Administered 2019-01-19: 4 mg via INTRAVENOUS

## 2019-01-19 MED ORDER — FENTANYL CITRATE (PF) 100 MCG/2ML IJ SOLN
INTRAMUSCULAR | Status: AC
  Filled 2019-01-19: qty 2

## 2019-01-19 MED ORDER — FAMOTIDINE 20 MG PO TABS
ORAL_TABLET | ORAL | Status: AC
  Administered 2019-01-19: 20 mg via ORAL
  Filled 2019-01-19: qty 1

## 2019-01-19 MED ORDER — FENTANYL CITRATE (PF) 100 MCG/2ML IJ SOLN
INTRAMUSCULAR | Status: DC | PRN
  Administered 2019-01-19 (×2): 50 ug via INTRAVENOUS

## 2019-01-19 MED ORDER — OXYCODONE HCL 5 MG PO TABS: 5.0000 mg | ORAL_TABLET | ORAL | 0 refills | Status: AC | PRN

## 2019-01-19 MED ORDER — ONDANSETRON HCL 4 MG/2ML IJ SOLN: 4.0000 mg | Freq: Four times a day (QID) | INTRAMUSCULAR | Status: DC | PRN

## 2019-01-19 MED ORDER — DEXAMETHASONE SODIUM PHOSPHATE 10 MG/ML IJ SOLN
INTRAMUSCULAR | Status: AC
  Filled 2019-01-19: qty 1

## 2019-01-19 MED ORDER — LACTATED RINGERS IV SOLN
INTRAVENOUS | Status: DC | PRN
  Administered 2019-01-19: 09:00:00 via INTRAVENOUS

## 2019-01-19 MED ORDER — BUPIVACAINE HCL (PF) 0.5 % IJ SOLN
INTRAMUSCULAR | Status: AC
  Filled 2019-01-19: qty 10

## 2019-01-19 MED ORDER — ROCURONIUM BROMIDE 100 MG/10ML IV SOLN
INTRAVENOUS | Status: DC | PRN
  Administered 2019-01-19: 10 mg via INTRAVENOUS
  Administered 2019-01-19: 50 mg via INTRAVENOUS
  Administered 2019-01-19: 20 mg via INTRAVENOUS

## 2019-01-19 MED ORDER — ONDANSETRON HCL 4 MG PO TABS: 4.0000 mg | ORAL_TABLET | Freq: Four times a day (QID) | ORAL | Status: DC | PRN

## 2019-01-19 MED ORDER — BUPIVACAINE LIPOSOME 1.3 % IJ SUSP
INTRAMUSCULAR | Status: AC
  Filled 2019-01-19: qty 20

## 2019-01-19 MED ORDER — BUPIVACAINE HCL (PF) 0.5 % IJ SOLN
INTRAMUSCULAR | Status: DC | PRN
  Administered 2019-01-19: 10 mL via PERINEURAL

## 2019-01-19 SURGICAL SUPPLY — 47 items
ANCHOR JUGGERKNOT WTAP NDL 2.9 (Anchor) ×2 IMPLANT
BIT DRILL JUGRKNT W/NDL BIT2.9 (DRILL) IMPLANT
BLADE FULL RADIUS 3.5 (BLADE) ×3 IMPLANT
BUR ACROMIONIZER 4.0 (BURR) ×1 IMPLANT
CANNULA SHAVER 8MMX76MM (CANNULA) ×3 IMPLANT
CHLORAPREP W/TINT 26 (MISCELLANEOUS) ×3 IMPLANT
COVER MAYO STAND STRL (DRAPES) ×3 IMPLANT
COVER WAND RF STERILE (DRAPES) ×3 IMPLANT
DRAPE IMP U-DRAPE 54X76 (DRAPES) ×6 IMPLANT
DRILL JUGGERKNOT W/NDL BIT 2.9 (DRILL) ×3
ELECT REM PT RETURN 9FT ADLT (ELECTROSURGICAL) ×3
ELECTRODE REM PT RTRN 9FT ADLT (ELECTROSURGICAL) ×1 IMPLANT
GAUZE SPONGE 4X4 12PLY STRL (GAUZE/BANDAGES/DRESSINGS) ×3 IMPLANT
GAUZE XEROFORM 1X8 LF (GAUZE/BANDAGES/DRESSINGS) ×3 IMPLANT
GLOVE BIO SURGEON STRL SZ7.5 (GLOVE) ×6 IMPLANT
GLOVE BIO SURGEON STRL SZ8 (GLOVE) ×10 IMPLANT
GLOVE BIOGEL PI IND STRL 8 (GLOVE) ×1 IMPLANT
GLOVE BIOGEL PI INDICATOR 8 (GLOVE) ×2
GLOVE INDICATOR 8.0 STRL GRN (GLOVE) ×7 IMPLANT
GOWN STRL REUS W/ TWL LRG LVL3 (GOWN DISPOSABLE) ×1 IMPLANT
GOWN STRL REUS W/ TWL XL LVL3 (GOWN DISPOSABLE) ×1 IMPLANT
GOWN STRL REUS W/TWL LRG LVL3 (GOWN DISPOSABLE) ×4
GOWN STRL REUS W/TWL XL LVL3 (GOWN DISPOSABLE) ×4
GRASPER SUT 15 45D LOW PRO (SUTURE) IMPLANT
IV LACTATED RINGER IRRG 3000ML (IV SOLUTION) ×4
IV LR IRRIG 3000ML ARTHROMATIC (IV SOLUTION) ×2 IMPLANT
MANIFOLD NEPTUNE II (INSTRUMENTS) ×3 IMPLANT
MASK FACE SPIDER DISP (MASK) ×1 IMPLANT
MAT ABSORB  FLUID 56X50 GRAY (MISCELLANEOUS) ×2
MAT ABSORB FLUID 56X50 GRAY (MISCELLANEOUS) ×1 IMPLANT
NDL FILTER BLUNT 18X1 1/2 (NEEDLE) IMPLANT
NEEDLE FILTER BLUNT 18X 1/2SAF (NEEDLE) ×2
NEEDLE FILTER BLUNT 18X1 1/2 (NEEDLE) ×1 IMPLANT
PACK ARTHROSCOPY SHOULDER (MISCELLANEOUS) ×3 IMPLANT
SLING ARM LRG DEEP (SOFTGOODS) ×1 IMPLANT
SLING ULTRA II LG (MISCELLANEOUS) ×3 IMPLANT
STAPLER SKIN PROX 35W (STAPLE) ×3 IMPLANT
STRAP SAFETY 5IN WIDE (MISCELLANEOUS) ×3 IMPLANT
SUT ETHIBOND 0 MO6 C/R (SUTURE) ×3 IMPLANT
SUT VIC AB 2-0 CT1 27 (SUTURE) ×4
SUT VIC AB 2-0 CT1 TAPERPNT 27 (SUTURE) ×2 IMPLANT
SYR 3ML LL SCALE MARK (SYRINGE) ×2 IMPLANT
TAPE MICROFOAM 4IN (TAPE) ×3 IMPLANT
TUBING ARTHRO INFLOW-ONLY STRL (TUBING) ×3 IMPLANT
TUBING CONNECTING 10 (TUBING) ×2 IMPLANT
TUBING CONNECTING 10' (TUBING) ×1
WAND WEREWOLF FLOW 90D (MISCELLANEOUS) ×3 IMPLANT

## 2019-01-19 NOTE — Anesthesia Preprocedure Evaluation (Signed)
Anesthesia Evaluation  Patient identified by MRN, date of birth, ID band Patient awake    Reviewed: Allergy & Precautions, NPO status , Patient's Chart, lab work & pertinent test results  History of Anesthesia Complications Negative for: history of anesthetic complications  Airway Mallampati: III  TM Distance: >3 FB Neck ROM: Full    Dental no notable dental hx.    Pulmonary asthma , Sleep apnea: had test but hasn't gotten results. , Current Smoker,    breath sounds clear to auscultation- rhonchi (-) wheezing      Cardiovascular hypertension, Pt. on medications (-) CAD, (-) Past MI, (-) Cardiac Stents and (-) CABG  Rhythm:Regular Rate:Normal - Systolic murmurs and - Diastolic murmurs    Neuro/Psych neg Seizures negative neurological ROS  negative psych ROS   GI/Hepatic Neg liver ROS, GERD  ,  Endo/Other  negative endocrine ROSneg diabetes  Renal/GU negative Renal ROS     Musculoskeletal negative musculoskeletal ROS (+)   Abdominal (+) + obese,   Peds  Hematology negative hematology ROS (+)   Anesthesia Other Findings Past Medical History: No date: Asthma     Comment:  well controlled No date: Enlarged liver No date: GERD (gastroesophageal reflux disease)     Comment:  occ No date: Hx of chronic sinusitis No date: Hyperlipidemia No date: Hypertension   Reproductive/Obstetrics                             Anesthesia Physical Anesthesia Plan  ASA: II  Anesthesia Plan: General   Post-op Pain Management:  Regional for Post-op pain   Induction: Intravenous  PONV Risk Score and Plan: 0 and Ondansetron and Midazolam  Airway Management Planned: Oral ETT  Additional Equipment:   Intra-op Plan:   Post-operative Plan: Extubation in OR  Informed Consent: I have reviewed the patients History and Physical, chart, labs and discussed the procedure including the risks, benefits and  alternatives for the proposed anesthesia with the patient or authorized representative who has indicated his/her understanding and acceptance.     Dental advisory given  Plan Discussed with: CRNA and Anesthesiologist  Anesthesia Plan Comments:         Anesthesia Quick Evaluation

## 2019-01-19 NOTE — Anesthesia Post-op Follow-up Note (Signed)
Anesthesia QCDR form completed.        

## 2019-01-19 NOTE — Transfer of Care (Signed)
Immediate Anesthesia Transfer of Care Note  Patient: Rodney [facility]  Procedure(s) Performed: LEFT SHOULDER ARTHROSCOPY WITH DEBRIDEMENT, BICEPS TENODESIS (Left Shoulder)  Patient Location: PACU  Anesthesia Type:General  Level of Consciousness: sedated  Airway & Oxygen Therapy: Patient Spontanous Breathing  Post-op Assessment: Report given to RN  Post vital signs: Reviewed and stable  Last Vitals:  Vitals Value Taken Time  BP 127/71 01/19/19 1052  Temp    Pulse 93 01/19/19 1055  Resp 21 01/19/19 1055  SpO2 94 % 01/19/19 1055  Vitals shown include unvalidated device data.  Last Pain:  Vitals:   01/19/19 0736  TempSrc: Temporal  PainSc: 5          Complications: No apparent anesthesia complications

## 2019-01-19 NOTE — Anesthesia Procedure Notes (Addendum)
Anesthesia Regional Block: Interscalene brachial plexus block   Pre-Anesthetic Checklist: ,, timeout performed, Correct Patient, Correct Site, Correct Laterality, Correct Procedure, Correct Position, site marked, Risks and benefits discussed,  Surgical consent,  Pre-op evaluation,  At surgeon's request and post-op pain management  Laterality: Left  Prep: chloraprep       Needles:  Injection technique: Single-shot  Needle Type: Stimiplex     Needle Length: 5cm  Needle Gauge: 22     Additional Needles:   Procedures:,,,, ultrasound used (permanent image in chart),,,,  Narrative:  Start time: 01/19/2019 8:40 AM End time: 01/19/2019 8:46 AM Injection made incrementally with aspirations every 5 mL.  Performed by: Personally  Anesthesiologist: Emmie Niemann, MD  Additional Notes: Functioning IV was confirmed and monitors were applied.  A Stimuplex needle was used. Sterile prep and drape,hand hygiene and sterile gloves were used.  Negative aspiration and negative test dose prior to incremental administration of local anesthetic. The patient tolerated the procedure well.

## 2019-01-19 NOTE — H&P (Signed)
Paper H&P to be scanned into permanent record. H&P reviewed and patient re-examined. No changes. 

## 2019-01-19 NOTE — Discharge Instructions (Addendum)
Orthopedic discharge instructions: Keep dressing dry and intact.  May shower after dressing changed on post-op day #4 (Saturday).  Cover staples with Band-Aids after drying off. Apply ice frequently to shoulder. Take ibuprofen 800 mg TID with meals for 7-10 days, then as necessary. Take oxycodone as prescribed when needed.  May supplement with ES Tylenol if necessary. Keep shoulder immobilizer on at all times except may remove for bathing purposes. Follow-up in 10-14 days or as scheduled.  AMBULATORY SURGERY  DISCHARGE INSTRUCTIONS   1) The drugs that you were given will stay in your system until tomorrow so for the next 24 hours you should not:  A) Drive an automobile B) Make any legal decisions C) Drink any alcoholic beverage   2) You may resume regular meals tomorrow.  Today it is better to start with liquids and gradually work up to solid foods.  You may eat anything you prefer, but it is better to start with liquids, then soup and crackers, and gradually work up to solid foods.   3) Please notify your doctor immediately if you have any unusual bleeding, trouble breathing, redness and pain at the surgery site, drainage, fever, or pain not relieved by medication.    4) Additional Instructions:        Please contact your physician with any problems or Same Day Surgery at [phone], Monday through Friday 6 am to 4 pm, or [place] at [facility] number at [phone].

## 2019-01-19 NOTE — Anesthesia Postprocedure Evaluation (Signed)
Anesthesia Post Note  Patient: Rodney [facility]  Procedure(s) Performed: LEFT SHOULDER ARTHROSCOPY WITH DEBRIDEMENT, BICEPS TENODESIS (Left Shoulder)  Patient location during evaluation: PACU Anesthesia Type: General Level of consciousness: awake and alert and oriented Pain management: pain level controlled Vital Signs Assessment: post-procedure vital signs reviewed and stable Respiratory status: spontaneous breathing, nonlabored ventilation and respiratory function stable Cardiovascular status: blood pressure returned to baseline and stable Postop Assessment: no signs of nausea or vomiting Anesthetic complications: no     Last Vitals:  Vitals:   01/19/19 1130 01/19/19 1158  BP: 128/70 117/75  Pulse: 95 90  Resp: 18 18  Temp: (!) 36.2 C   SpO2: 92% 92%    Last Pain:  Vitals:   01/19/19 1158  TempSrc:   PainSc: 0-No pain                 Zakary Kimura

## 2019-01-19 NOTE — Op Note (Signed)
01/19/2019  10:57 AM  Patient:   Rodney Copeland  Pre-Op Diagnosis:   Impingement/tendinopathy with biceps tendinopathy, left shoulder.  Post-Op Diagnosis:   Impingement/tendinopathy with Type I SLAP tear and biceps tendinopathy, left shoulder.  Procedure:   Limited arthroscopic debridement with debridement of Type I labral tear and mini-open biceps tenodesis, left shoulder.  Anesthesia:   General endotracheal with interscalene block using Exparel placed preoperatively by the anesthesiologist.  Surgeon:   Maryagnes AmosJ. Jeffrey Latanja Lehenbauer, MD  Assistant:   Horris LatinoLance McGhee, PA-C  Findings:   As above.  There was an area of significant labral tearing superiorly without frank detachment from the glenoid, as well as a small area of labral fraying/tearing posteriorly.  There were grade 1 chondromalacial changes of the glenoid, but no significant degenerative changes of the humerus.  There was perhaps a small partial-thickness tear of the superior insertional fibers of the subscapularis tendon involving less than 5% of the total footprint area.  The remainder of the rotator cuff was in excellent condition.  The biceps tendon demonstrated areas of "lip sticking", but there were no partial or full-thickness tears.  Complications:   None  Fluids:   800 cc  Estimated blood loss:   5 cc  Tourniquet time:   None  Drains:   None  Closure:   Staples      Brief clinical note:   The patient is a 48 year old male with a 4-5 month history of recurrent left shoulder pain resulting from an apparent lifting injury while carrying a dresser. The patient's symptoms have progressed despite medications, activity modification, etc. The patient's history and examination are consistent with impingement/tendinopathy with a possible SLAP tear versus biceps tendinopathy. The preoperative MRI scan showed no significant tearing of the rotator cuff, but did demonstrate increased fluid around the biceps, possibly consistent with biceps  tendinopathy. It also demonstrated evidence of possible tearing of the superior portion of the labrum. The patient presents at this time for definitive management of these shoulder symptoms.  Procedure:   The patient underwent placement of an interscalene block using Exparel by the anesthesiologist in the preoperative holding area before being brought into the operating room and lain in the supine position. The patient then underwent general endotracheal intubation and anesthesia before being repositioned in the beach chair position using the beach chair positioner. The left shoulder and upper extremity were prepped with ChloraPrep solution before being draped sterilely. Preoperative antibiotics were administered. A timeout was performed to confirm the proper surgical site before the expected portal sites and incision site were injected with 0.5% Sensorcaine with epinephrine. A posterior portal was created and the glenohumeral joint thoroughly inspected with the findings as described above. An anterior portal was created using an outside-in technique. The labrum and rotator cuff were further probed, again confirming the above-noted findings. The areas of labral fraying were debrided back to stable margins using the full-radius resector, as were several areas of focal synovitis anterosuperiorly and posterosuperiorly. The ArthroCare wand was inserted and used to release the biceps tendon from its labral anchor. It also was used to obtain hemostasis as well as to "anneal" the labrum superiorly and anteriorly. The instruments were removed from the joint after suctioning the excess fluid.  The camera was repositioned through the posterior portal into the subacromial space. A separate lateral portal was created using an outside-in technique. The 3.5 mm full-radius resector was introduced and used to perform a subtotal bursectomy. The ArthroCare wand was then inserted and used to  remove the periosteal tissue off the  undersurface of the anterior third of the acromion as well as to obtain hemostasis. No formal acromioplasty was performed as he had had this previously and there was no evidence of any recurrent spurring. The instruments were then removed from the subacromial space after suctioning the excess fluid.  An approximately 4-5 cm incision was made over the anterolateral aspect of the shoulder beginning at the anterolateral corner of the acromion and extending distally in line with the bicipital groove. This incision was carried down through the subcutaneous tissues to expose the deltoid fascia. The raphae between the anterior and middle thirds was identified and this plane developed to provide access into the subacromial space. Additional bursal tissues were debrided sharply using Metzenbaum scissors. The rotator cuff was readily identified and carefully inspected. There is no evidence of any bursal surface tearing.   The bicipital groove was identified by palpation and opened for 1-1.5 cm. The biceps tendon stump was retrieved through this defect. The floor of the bicipital groove was roughened with a curet before a a single Biomet 2.9 mm JuggerKnot anchor was inserted. Both sets of sutures were passed through the biceps tendon and tied securely to effect the tenodesis. The bicipital sheath was reapproximated using two #0 Ethibond interrupted sutures, incorporating the biceps tendon to further reinforce the tenodesis.  The wound was copiously irrigated with sterile saline solution before the deltoid raphae was reapproximated using 2-0 Vicryl interrupted sutures. The subcutaneous tissues were closed in two layers using 2-0 Vicryl interrupted sutures before the skin was closed using staples. The portal sites also were closed using staples. A sterile bulky dressing was applied to the shoulder before the arm was placed into a shoulder immobilizer. The patient was then awakened, extubated, and returned to the recovery  room in satisfactory condition after tolerating the procedure well.

## 2019-01-19 NOTE — Anesthesia Procedure Notes (Signed)
Procedure Name: Intubation Date/Time: 01/19/2019 9:29 AM Performed by: Justus Memory, CRNA Pre-anesthesia Checklist: Patient identified, Patient being monitored, Timeout performed, Emergency Drugs available and Suction available Patient Re-evaluated:Patient Re-evaluated prior to induction Oxygen Delivery Method: Circle system utilized Preoxygenation: Pre-oxygenation with 100% oxygen Induction Type: IV induction Ventilation: Mask ventilation without difficulty Laryngoscope Size: Mac and 3 Grade View: Grade I Tube type: Oral Tube size: 7.0 mm Number of attempts: 1 Airway Equipment and Method: Stylet Placement Confirmation: ETT inserted through vocal cords under direct vision,  positive ETCO2 and breath sounds checked- equal and bilateral Secured at: 23 cm Tube secured with: Tape Dental Injury: Teeth and Oropharynx as per pre-operative assessment

## 2019-01-20 ENCOUNTER — Telehealth: Payer: Self-pay | Admitting: Internal Medicine

## 2019-01-20 DIAGNOSIS — G4733 Obstructive sleep apnea (adult) (pediatric): Secondary | ICD-10-CM | POA: Diagnosis not present

## 2019-01-20 NOTE — Telephone Encounter (Signed)
HST performed on 01/14/2019 confirmed moderate OSA with AHI of 18. Recommend auto cpap 5-20cm h2O. Left message for pt.

## 2019-01-21 NOTE — Telephone Encounter (Signed)
Pt is aware of results and wished to proceed with cpap.  Order has been placed.  Pt has pending OV for 03/22/2019. Nothing further is needed.

## 2019-01-21 NOTE — Telephone Encounter (Signed)
Left message x2 for pt. 

## 2019-03-19 NOTE — Progress Notes (Signed)
[facility] Consultation      Assessment and Plan:  Asthma, chronic bronchitis. - Appears improved with Symbicort inhaler, notes it is causing him in the $200 per 3 months.  Given a prescription for Wixella inhaler to see if it is less expensive.  Nicotine abuse. - Discussed the importance of smoke cessation, spent 3 minutes in discussion.  He is planning on joining a smoking cessation group through his workplace.  Obesity. - Recent weight gain of about 40 pounds which may be contributing and coinciding with recent increase in dyspnea. - He has a shoulder injury which is decreased his physical activity level, discussed importance of increasing his physical activity and limiting calories.  Obstructive sleep apnea. -Doing well with CPAP. - He is requiring a fairly elevated pressure of up to 16.  We will adjust auto pressure range to 8-20.  No orders of the defined types were placed in this encounter.  No orders of the defined types were placed in this encounter.  No follow-ups on file.   Date: 03/19/2019  MRN# [id number] Mc Bloodworth 07/01/1971    Yandiel Bergum is a 48 y.o. old male seen in consultation for chief complaint of:    Chief Complaint  Patient presents with  . Follow-up    wearing cpap avg-5-8hr nightly . feels pressure & mask are okay.     HPI:  Elver Stadler is a 48 y.o. male last seen with symptoms of excessive daytime sleepiness and dyspnea on exertion.  His dyspnea was thought to be secondary to asthma/chronic bronchitis with nicotine abuse and weight gain.  He was asked to use Symbicort, increase physical activity, he was sent for a lung function test which is currently pending, chest x-ray was performed which showed some changes of chronic bronchitis. He was sent for a home sleep test which showed moderate obstructive sleep apnea. He has been using cpap every night and feeling "so much better" he is no longer tired and  falling asleep during the day, and he is no longer snoring.  He is cleaning supplies daily.  He is smoking 1 ppd, thinking of quitting, he has tried chantix in the past but had severe nausea and had to stop. He has tried nicotine patches but caused insomnia.  He is using symbicort 2 puffs bid, and feels that it has helped with his breathing.   **CPAP download 02/16/2019-03/17/2019>> raw data personally reviewed, usage greater than 4 hours is 21/30 days, average usage on days used is 6 hours 17 minutes.  Pressure ranges 5-20.  Median pressure is 10, 95th percentile pressure is 14.5, maximum pressures 15.9.  Residual AHI is 0.8, leaks are within normal limits.  Overall this shows adequate compliance with excellent control of obstructive sleep apnea. **HST 01/14/2019>> moderate OSA with AHI of 18.  Recommended auto CPAP with pressure range 5-20. **Chest x-ray 01/07/2019>> imaging personally viewed, changes of chronic bronchitis, lungs are otherwise unremarkable.  Social Hx:   Social History   Tobacco Use  . Smoking status: Current Every Day Smoker    Packs/day: 1.00    Years: 20.00    Pack years: 20.00    Types: Cigarettes  . Smokeless tobacco: Former Systems developer  . Tobacco comment: vaped for about 9 months, two years ago  Substance Use Topics  . Alcohol use: No  . Drug use: No   Medication:    Current Outpatient Medications:  .  aspirin EC 81 MG tablet, Take 81 mg by mouth  daily., Disp: , Rfl:  .  budesonide-formoterol (SYMBICORT) 160-4.5 MCG/ACT inhaler, Inhale 2 puffs into the lungs 2 (two) times daily. Rinse mouth after use, Disp: 1 Inhaler, Rfl: 12 .  esomeprazole (NEXIUM) 20 MG capsule, Take 20 mg by mouth as needed., Disp: , Rfl:  .  fluticasone (FLONASE) 50 MCG/ACT nasal spray, Place 2 sprays into both nostrils daily as needed for allergies. , Disp: , Rfl:  .  ibuprofen (ADVIL) 800 MG tablet, Take 800 mg by mouth every 8 (eight) hours as needed for headache or moderate pain. , Disp: , Rfl:   .  lisinopril (ZESTRIL) 20 MG tablet, Take 20 mg by mouth every morning. , Disp: , Rfl:  .  oxyCODONE (ROXICODONE) 5 MG immediate release tablet, Take 1-2 tablets (5-10 mg total) by mouth every 4 (four) hours as needed for moderate pain or severe pain., Disp: 50 tablet, Rfl: 0 .  rosuvastatin (CRESTOR) 20 MG tablet, Take 20 mg by mouth every morning. , Disp: , Rfl:  .  VENTOLIN HFA 108 (90 Base) MCG/ACT inhaler, Inhale 2 puffs into the lungs every 4 (four) hours as needed for wheezing or shortness of breath. , Disp: , Rfl:    Allergies:  Patient has no known allergies.      LABORATORY PANEL:   CBC No results for input(s): WBC, HGB, HCT, PLT in the last 168 hours. ------------------------------------------------------------------------------------------------------------------  Chemistries  No results for input(s): NA, K, CL, CO2, GLUCOSE, BUN, CREATININE, CALCIUM, MG, AST, ALT, ALKPHOS, BILITOT in the last 168 hours.  Invalid input(s): GFRCGP ------------------------------------------------------------------------------------------------------------------  Cardiac Enzymes No results for input(s): TROPONINI in the last 168 hours. ------------------------------------------------------------  RADIOLOGY:  No results found.     Thank  you for the consultation and for allowing [facility] to assist in the care of your patient. Our recommendations are noted above.  Please contact us if we can be of further service.   Wells Guileseep Kaan Tosh, M.D., F.C.C.P.  Board Certified in Internal Medicine, Pulmonary Medicine, Critical Care Medicine, and Sleep Medicine.  [facility] Office Number: [phone]   03/19/2019

## 2019-03-22 ENCOUNTER — Encounter: Payer: Self-pay | Admitting: Internal Medicine

## 2019-03-22 ENCOUNTER — Other Ambulatory Visit: Payer: Self-pay

## 2019-03-22 ENCOUNTER — Ambulatory Visit (INDEPENDENT_AMBULATORY_CARE_PROVIDER_SITE_OTHER): Payer: 59 | Admitting: Internal Medicine

## 2019-03-22 VITALS — BP 140/78 | HR 93 | Temp 98.1°F | Ht 69.5 in | Wt 299.0 lb

## 2019-03-22 DIAGNOSIS — F17210 Nicotine dependence, cigarettes, uncomplicated: Secondary | ICD-10-CM

## 2019-03-22 DIAGNOSIS — G4733 Obstructive sleep apnea (adult) (pediatric): Secondary | ICD-10-CM | POA: Diagnosis not present

## 2019-03-22 DIAGNOSIS — J42 Unspecified chronic bronchitis: Secondary | ICD-10-CM | POA: Diagnosis not present

## 2019-03-22 MED ORDER — FLUTICASONE-SALMETEROL 250-50 MCG/DOSE IN AEPB: 1.0000 | INHALATION_SPRAY | Freq: Two times a day (BID) | RESPIRATORY_TRACT | 5 refills | Status: AC

## 2019-03-22 NOTE — Addendum Note (Signed)
Addended by: Maryanna Shape A on: 03/22/2019 03:02 PM   Modules accepted: Orders

## 2019-03-22 NOTE — Patient Instructions (Addendum)
Will adjust pressure range to 8--20 cm H2O.  Can change symbicort to wixela inhaler to see if it is less expensive.   --Quitting smoking is the most important thing that you can do for your health.  --Quitting smoking will have greater affect on your health than any medicine that we can give you.

## 2019-03-30 ENCOUNTER — Telehealth: Payer: Self-pay | Admitting: Internal Medicine

## 2019-03-30 NOTE — Telephone Encounter (Signed)
Rhonda please advise. Thanks 

## 2019-03-30 NOTE — Telephone Encounter (Signed)
Patient states needs to cancel PFT on 04/01/19.  Would like to reschedule.  Patient phone number is [phone].

## 2019-03-30 NOTE — Telephone Encounter (Signed)
Lm to really date/time of covid test.  03/31/2019 [facility] to 11[facility]

## 2019-03-30 NOTE — Telephone Encounter (Signed)
LMOVM for pt to return my call as [facility] are scheduling into November for PFT's and I will need a time of day he would like this R/S to.  Asked pt to return my call to [phone]. [name]  Pt did not show up for COVID testing, therefore, PFT appointment has been R/S for Tues 06/01/2019 at 4:15. Pt to arrive at 4:00 to [facility]. [name]

## 2019-03-31 ENCOUNTER — Other Ambulatory Visit: Payer: 59

## 2019-03-31 NOTE — Telephone Encounter (Signed)
Pt did not return call.  PFT information mailed to patient. Nothing else needed at this time. [name]

## 2019-04-01 ENCOUNTER — Ambulatory Visit: Payer: 59

## 2019-05-27 ENCOUNTER — Telehealth: Payer: Self-pay | Admitting: Internal Medicine

## 2019-05-27 NOTE — Telephone Encounter (Signed)
LM to relay date/time of  covid test. 05/31/2019 prior to 11:[facility]

## 2019-05-28 NOTE — Telephone Encounter (Signed)
Left message x2 for pt. 

## 2019-05-31 NOTE — Telephone Encounter (Signed)
Pt called to resch appt due to work schedule.  [phone].  Wants to resch a couple of months out.

## 2019-05-31 NOTE — Telephone Encounter (Signed)
PFT canceled and R/S to Tues Aug 24 2019 at 4:15. Pt to arrive at 4:00 to [facility] and check in at the Registration Desk.  PFT appointment information mailed to patient. [place] and LMOVM for pt of the new PFT appointment date/time. Nothing else needed at this time. [name]

## 2019-05-31 NOTE — Telephone Encounter (Signed)
Left message x3 for pt.  Spoke to pt's spouse, Tara(DPR), who is requesting that PFT be rescheduled.   Rhonda, please advise. Thanks

## 2019-06-01 ENCOUNTER — Ambulatory Visit: Payer: 59

## 2019-08-24 ENCOUNTER — Ambulatory Visit: Payer: BC Managed Care – PPO

## 2019-09-22 ENCOUNTER — Ambulatory Visit: Payer: Self-pay

## 2021-01-18 IMAGING — CR CHEST - 2 VIEW
1 series · 2 of 2 positions shown · non-contrast
Comparison: None.

CLINICAL DATA: Progressive dyspnea for the past year. History of
asthma and chronic bronchitis.

EXAM:
CHEST - 2 VIEW

[Series 1: dg chest 2 view · 0.14mm/px · 2 of 2 slices shown]
[im 1/2]
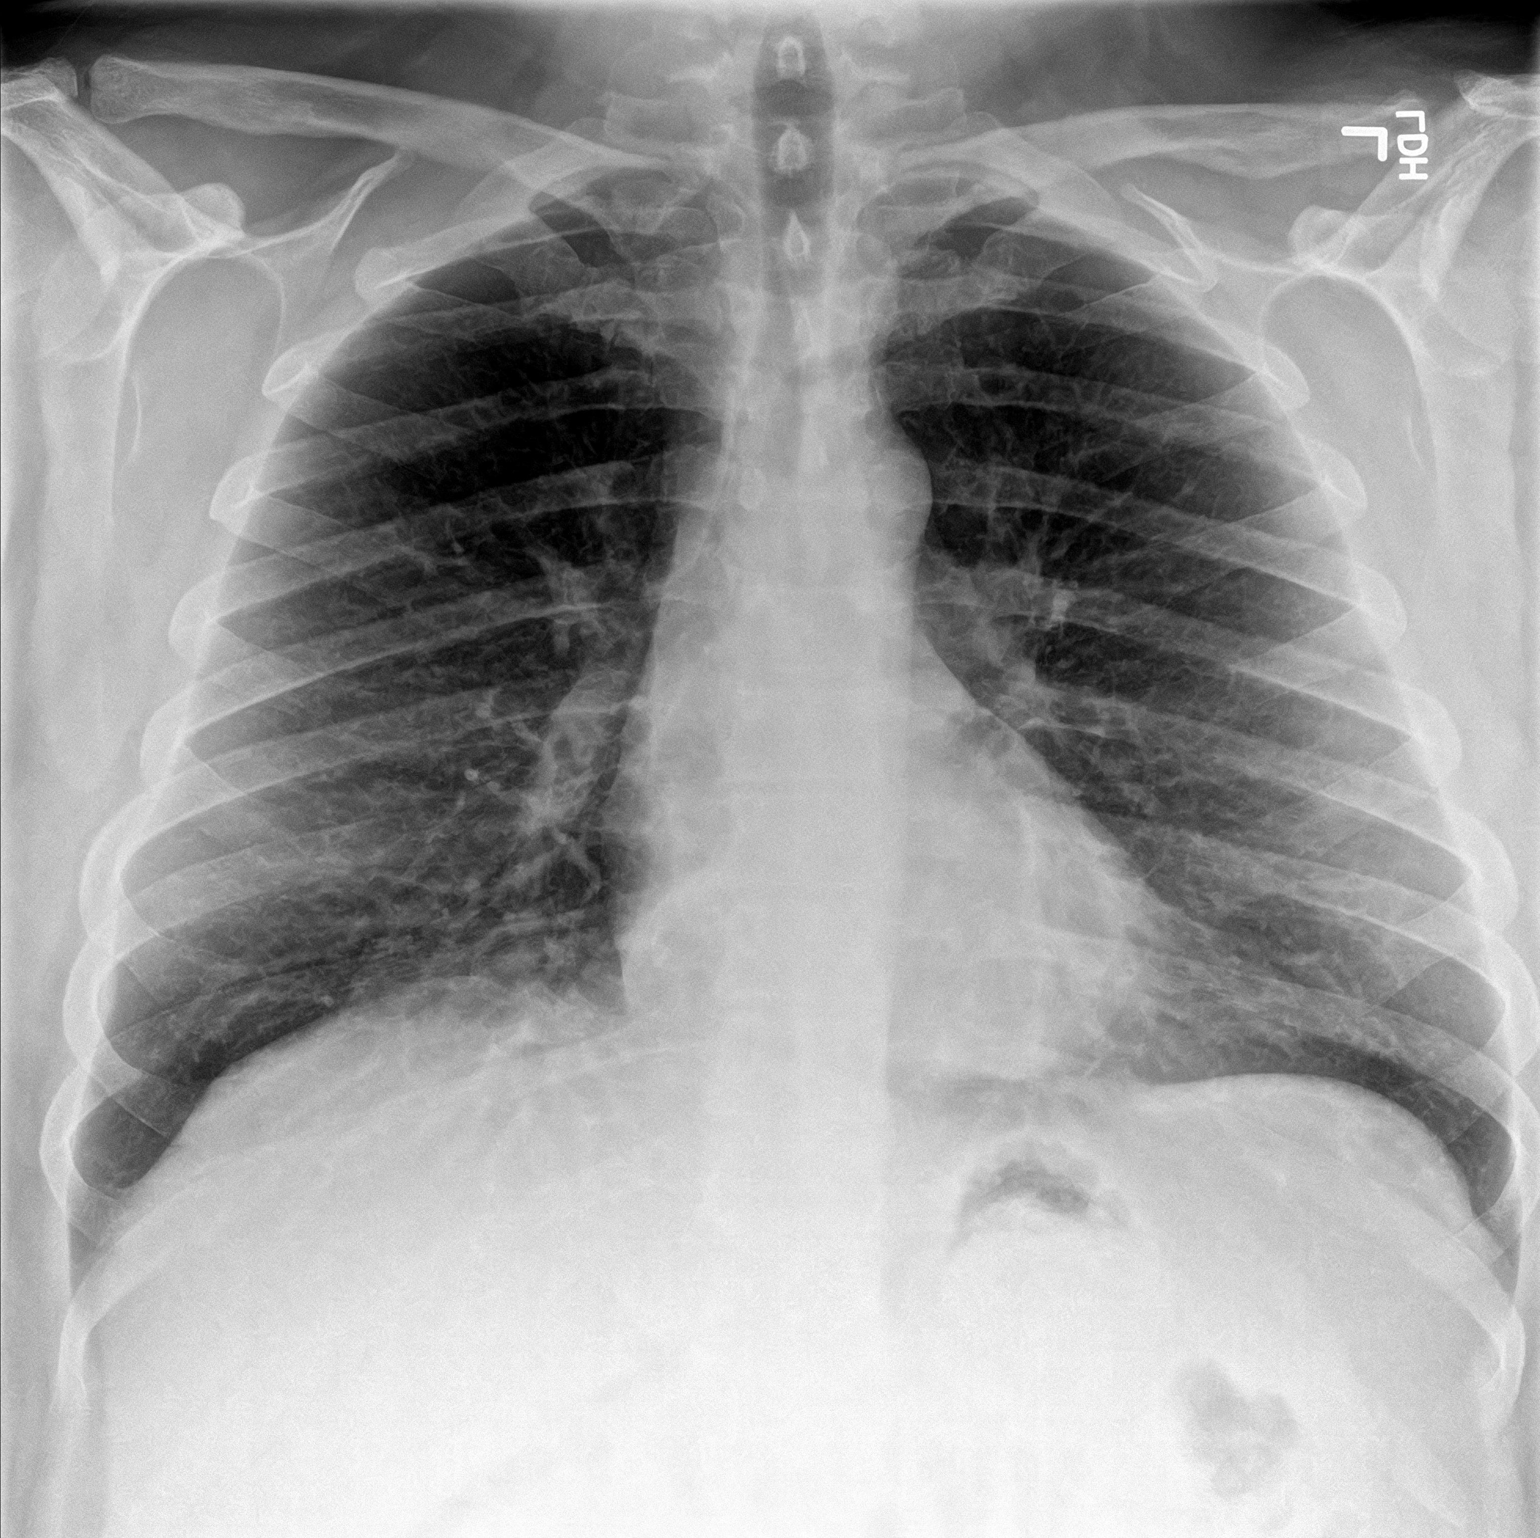
[im 2/2]
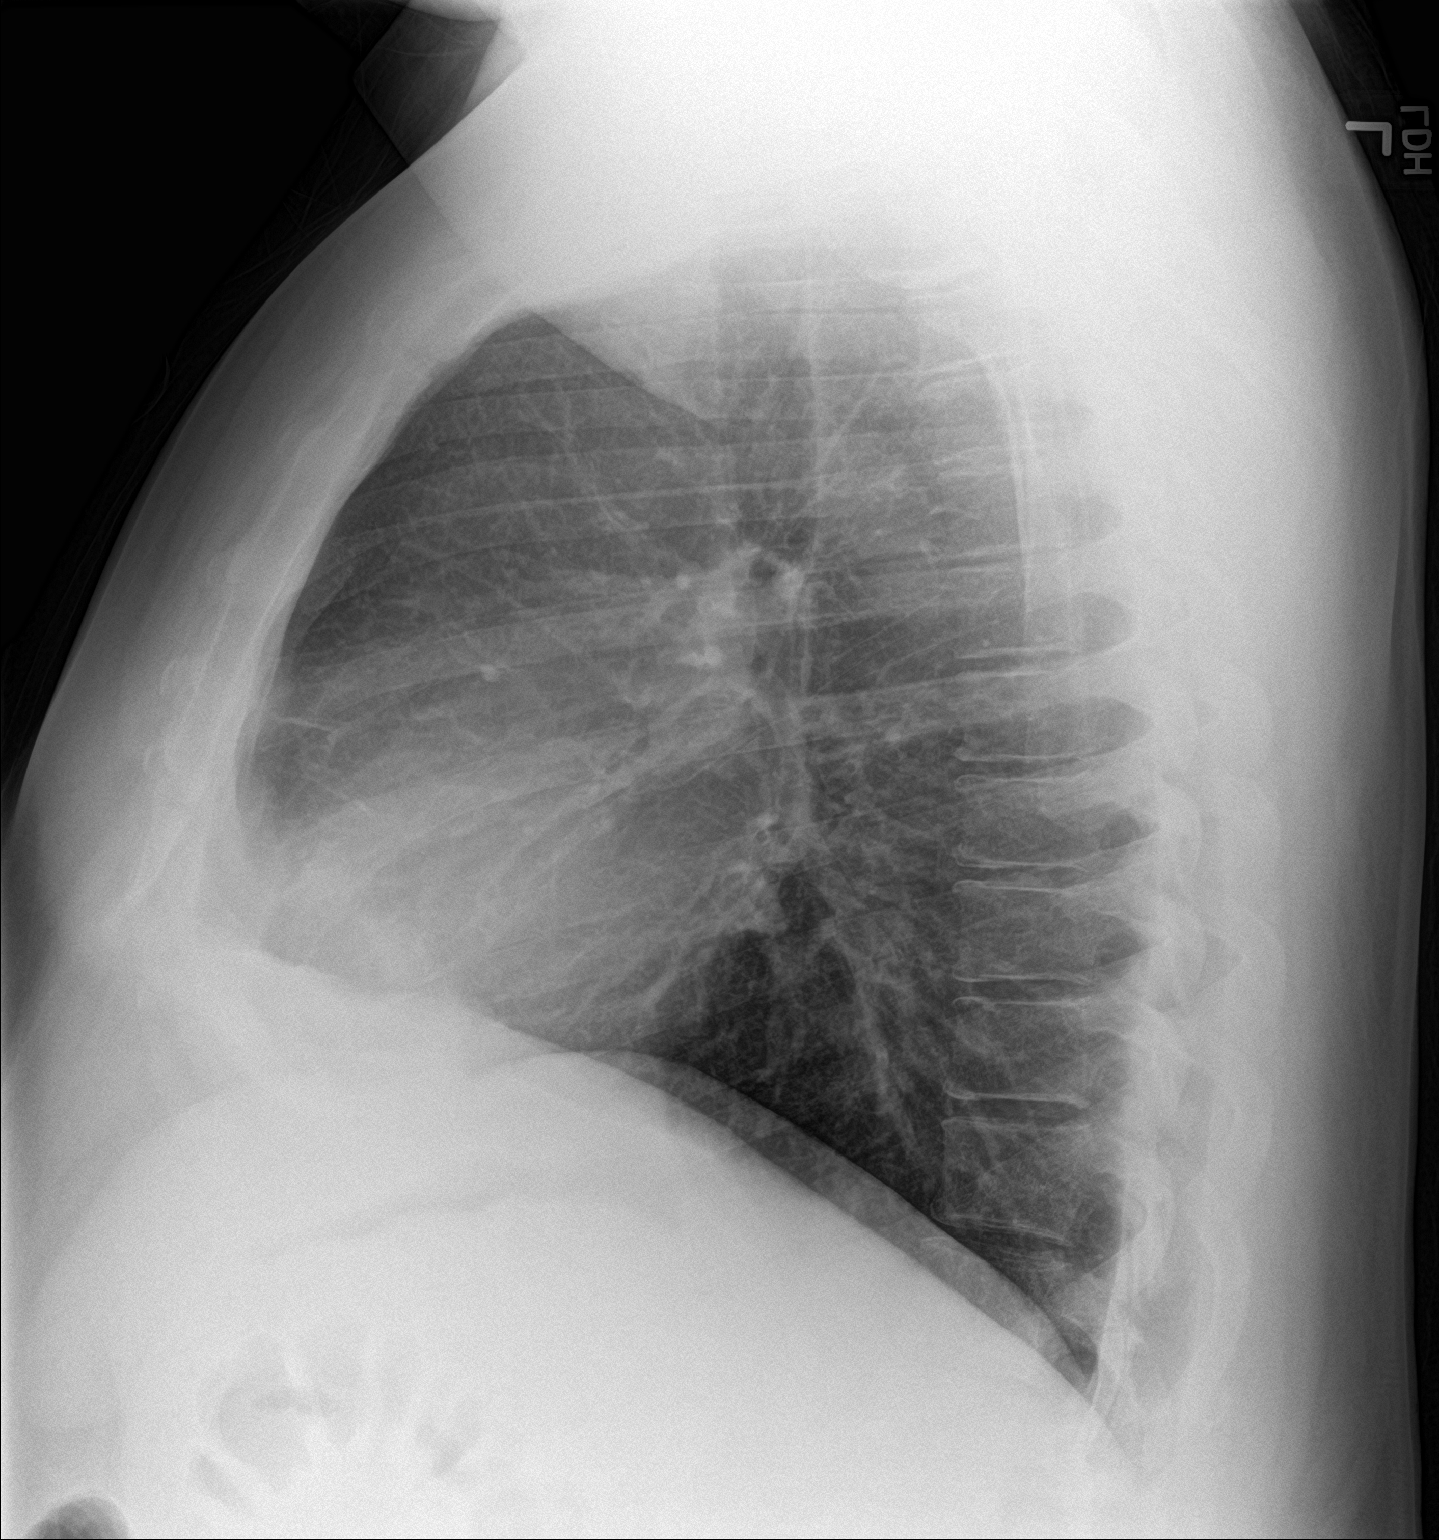

[2 of 2 positions shown; findings below may reference images not displayed]

FINDINGS: Normal sized heart. Clear lungs. Minimal peribronchial thickening.
Minimal thoracic spine degenerative changes.
IMPRESSION: Minimal bronchitic changes.
